# Patient Record
Sex: Female | Born: 2000 | Race: White | Hispanic: No | Marital: Single | Smoking: Never smoker
Health system: Southern US, Community
[De-identification: ages and names within clinical notes are randomized; demographics above are authoritative.]

## PROBLEM LIST (undated history)

## (undated) DIAGNOSIS — G459 Transient cerebral ischemic attack, unspecified: Secondary | ICD-10-CM

## (undated) HISTORY — DX: Transient cerebral ischemic attack, unspecified: G45.9

---

## 2020-07-05 ENCOUNTER — Emergency Department (HOSPITAL_COMMUNITY): Payer: Medicaid Other

## 2020-07-05 ENCOUNTER — Other Ambulatory Visit: Payer: Self-pay

## 2020-07-05 ENCOUNTER — Inpatient Hospital Stay (HOSPITAL_COMMUNITY)
Admission: EM | Admit: 2020-07-05 | Discharge: 2020-07-07 | DRG: 103 | Disposition: A | Discharge status: Home / Self Care | Payer: Medicaid Other | Attending: Neurology | Admitting: Neurology

## 2020-07-05 DIAGNOSIS — R200 Anesthesia of skin: Secondary | ICD-10-CM | POA: Diagnosis present

## 2020-07-05 DIAGNOSIS — G43109 Migraine with aura, not intractable, without status migrainosus: Principal | ICD-10-CM | POA: Diagnosis present

## 2020-07-05 DIAGNOSIS — F17210 Nicotine dependence, cigarettes, uncomplicated: Secondary | ICD-10-CM | POA: Diagnosis present

## 2020-07-05 DIAGNOSIS — Z79890 Hormone replacement therapy (postmenopausal): Secondary | ICD-10-CM

## 2020-07-05 DIAGNOSIS — I639 Cerebral infarction, unspecified: Secondary | ICD-10-CM

## 2020-07-05 DIAGNOSIS — G8191 Hemiplegia, unspecified affecting right dominant side: Secondary | ICD-10-CM | POA: Diagnosis present

## 2020-07-05 DIAGNOSIS — Z20822 Contact with and (suspected) exposure to covid-19: Secondary | ICD-10-CM | POA: Diagnosis present

## 2020-07-05 DIAGNOSIS — Q211 Atrial septal defect: Secondary | ICD-10-CM | POA: Diagnosis not present

## 2020-07-05 DIAGNOSIS — I6389 Other cerebral infarction: Secondary | ICD-10-CM | POA: Diagnosis not present

## 2020-07-05 DIAGNOSIS — R531 Weakness: Secondary | ICD-10-CM

## 2020-07-05 LAB — COMPREHENSIVE METABOLIC PANEL
ALT: 14 U/L (ref 0–44)
AST: 19 U/L (ref 15–41)
Albumin: 4.2 g/dL (ref 3.5–5.0)
Alkaline Phosphatase: 82 U/L (ref 38–126)
Anion gap: 4 — ABNORMAL LOW (ref 5–15)
BUN: 9 mg/dL (ref 6–20)
CO2: 28 mmol/L (ref 22–32)
Calcium: 9.2 mg/dL (ref 8.9–10.3)
Chloride: 105 mmol/L (ref 98–111)
Creatinine, Ser: 0.81 mg/dL (ref 0.44–1.00)
GFR, Estimated: >60 mL/min (ref >60)
Glucose, Bld: 98 mg/dL (ref 70–99)
Potassium: 3.3 mmol/L — ABNORMAL LOW (ref 3.5–5.1)
Sodium: 137 mmol/L (ref 135–145)
Total Bilirubin: 0.8 mg/dL (ref 0.3–1.2)
Total Protein: 8.2 g/dL — ABNORMAL HIGH (ref 6.5–8.1)

## 2020-07-05 LAB — CBC
HCT: 42.9 % (ref 36.0–46.0)
Hemoglobin: 14.2 g/dL (ref 12.0–15.0)
MCH: 29.3 pg (ref 26.0–34.0)
MCHC: 33.1 g/dL (ref 30.0–36.0)
MCV: 88.5 fL (ref 80.0–100.0)
Platelets: 322 10*3/uL (ref 150–400)
RBC: 4.85 MIL/uL (ref 3.87–5.11)
RDW: 12.1 % (ref 11.5–15.5)
WBC: 6 10*3/uL (ref 4.0–10.5)
nRBC: 0 % (ref 0.0–0.2)

## 2020-07-05 LAB — DIFFERENTIAL
Abs Immature Granulocytes: 0.01 10*3/uL (ref 0.00–0.07)
Basophils Absolute: 0 10*3/uL (ref 0.0–0.1)
Basophils Relative: 1 %
Eosinophils Absolute: 0.1 10*3/uL (ref 0.0–0.5)
Eosinophils Relative: 2 %
Immature Granulocytes: 0 %
Lymphocytes Relative: 31 %
Lymphs Abs: 1.9 10*3/uL (ref 0.7–4.0)
Monocytes Absolute: 0.6 10*3/uL (ref 0.1–1.0)
Monocytes Relative: 10 %
Neutro Abs: 3.3 10*3/uL (ref 1.7–7.7)
Neutrophils Relative %: 56 %

## 2020-07-05 LAB — I-STAT CHEM 8, ED
BUN: 9 mg/dL (ref 6–20)
Calcium, Ion: 1.25 mmol/L (ref 1.15–1.40)
Chloride: 104 mmol/L (ref 98–111)
Creatinine, Ser: 0.8 mg/dL (ref 0.44–1.00)
Glucose, Bld: 95 mg/dL (ref 70–99)
HCT: 44 % (ref 36.0–46.0)
Hemoglobin: 15 g/dL (ref 12.0–15.0)
Potassium: 3.4 mmol/L — ABNORMAL LOW (ref 3.5–5.1)
Sodium: 141 mmol/L (ref 135–145)
TCO2: 24 mmol/L (ref 22–32)

## 2020-07-05 LAB — I-STAT BETA HCG BLOOD, ED (MC, WL, AP ONLY): I-stat hCG, quantitative: <5 m[IU]/mL (ref <5)

## 2020-07-05 LAB — ANTITHROMBIN III: AntiThromb III Func: 120 % (ref 75–120)

## 2020-07-05 LAB — ETHANOL: Alcohol, Ethyl (B): <10 mg/dL (ref <10)

## 2020-07-05 LAB — PROTIME-INR
INR: 1 (ref 0.8–1.2)
Prothrombin Time: 13.4 seconds (ref 11.4–15.2)

## 2020-07-05 LAB — C-REACTIVE PROTEIN: CRP: 1.7 mg/dL — ABNORMAL HIGH (ref <1.0)

## 2020-07-05 LAB — APTT: aPTT: 33 seconds (ref 24–36)

## 2020-07-05 LAB — SEDIMENTATION RATE: Sed Rate: 32 mm/hr — ABNORMAL HIGH (ref 0–22)

## 2020-07-05 LAB — CBG MONITORING, ED: Glucose-Capillary: 74 mg/dL (ref 70–99)

## 2020-07-05 LAB — MRSA NEXT GEN BY PCR, NASAL: MRSA by PCR Next Gen: NOT DETECTED

## 2020-07-05 IMAGING — CT CT HEAD CODE STROKE
4 series · 16 of 47 positions shown, 18 images · non-contrast
Comparison: No pertinent prior exams available for comparison.

CLINICAL DATA: Code stroke. Neuro deficit, acute, stroke suspected.
Right-sided drift.

EXAM:
CT HEAD WITHOUT CONTRAST
TECHNIQUE: Contiguous axial images were obtained from the base of the skull
through the vertex without intravenous contrast.

[Series 3: head wo · axial · 0.42mm/px · z∈[+1294,+1404]mm · 7 of 30 slices shown, 9 images]
[im 4/30  brain]
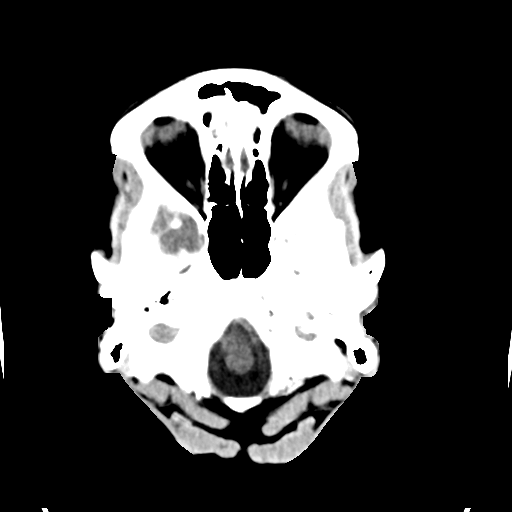
[im 4/30  bone]
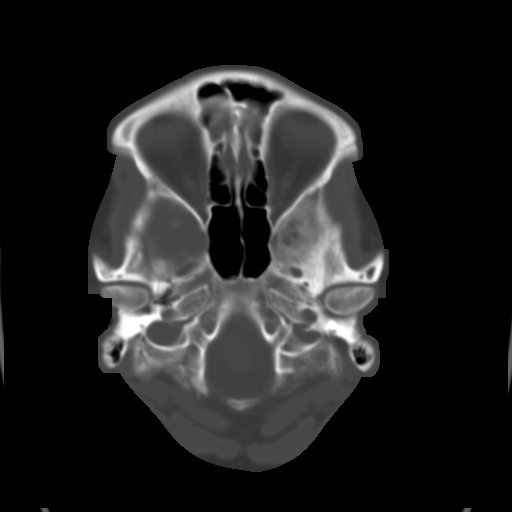
[im 8/30  brain]
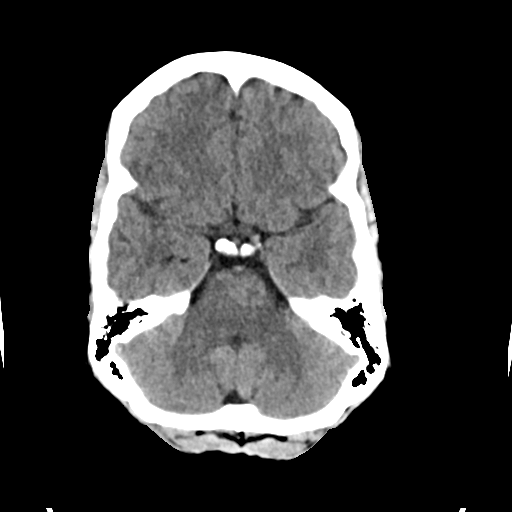
[im 11/30  brain]
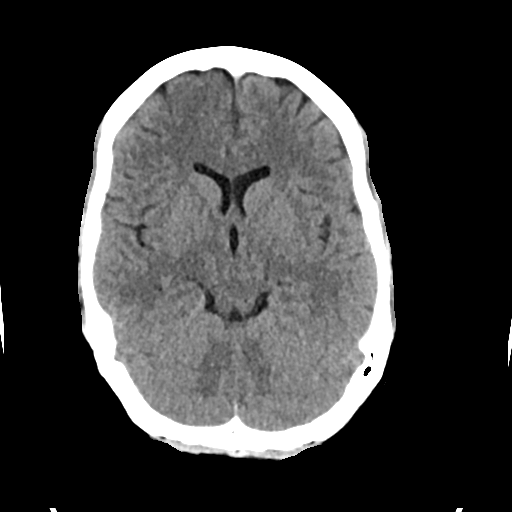
[im 15/30  brain]
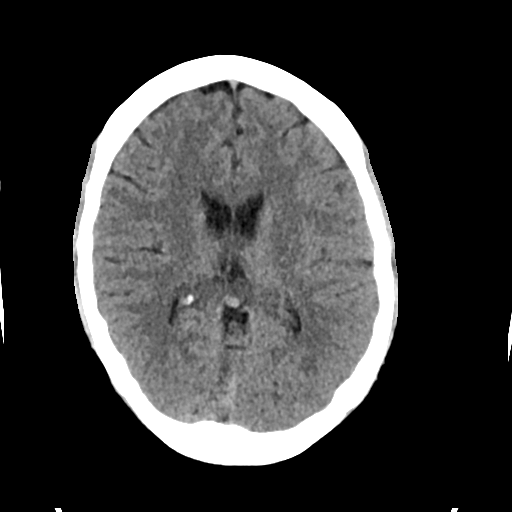
[im 19/30  brain]
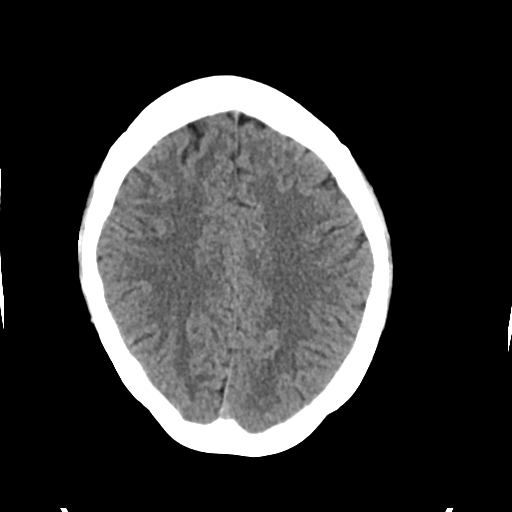
[im 19/30  bone]
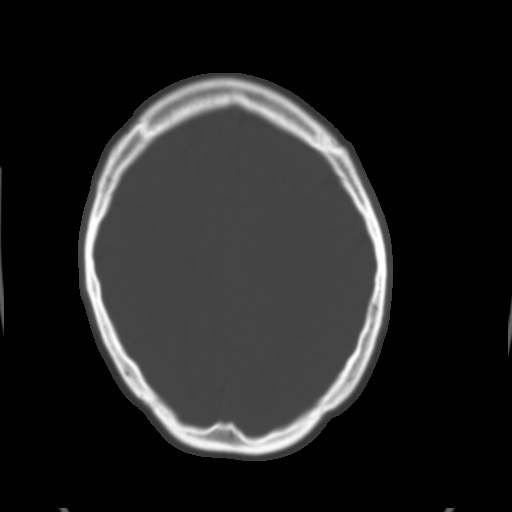
[im 22/30  brain]
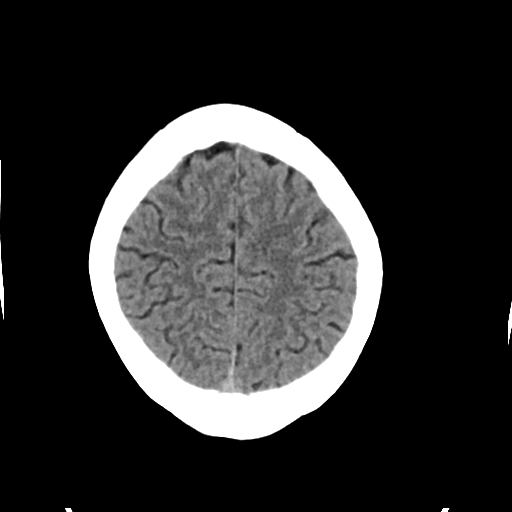
[im 26/30  brain]
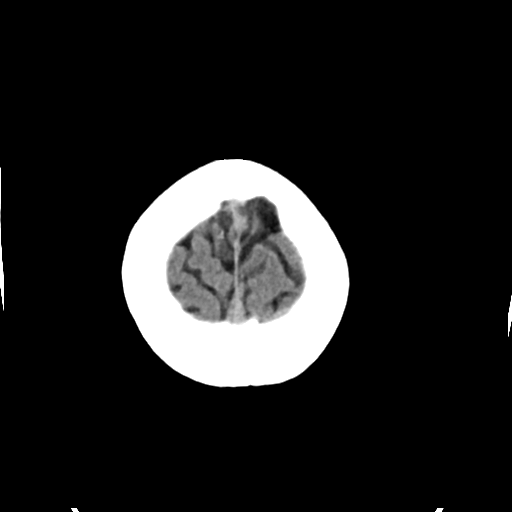

[Series 4: head bone · axial · 0.42mm/px · z∈[+1293,+1323]mm · 3 of 75 slices shown]
[im 8/75  bone]
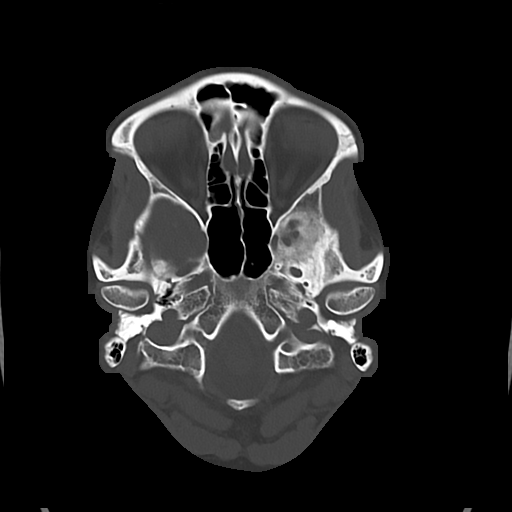
[im 15/75  bone]
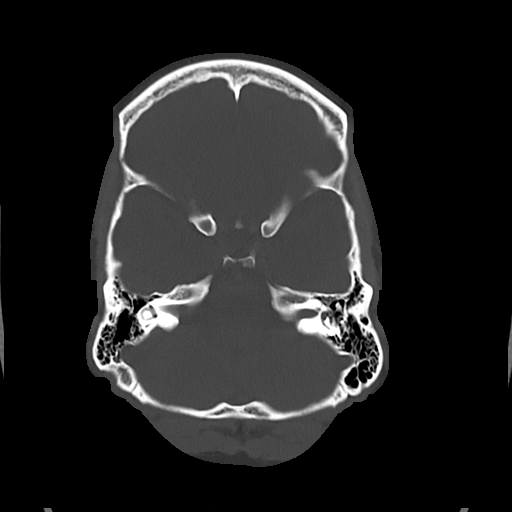
[im 23/75  bone]
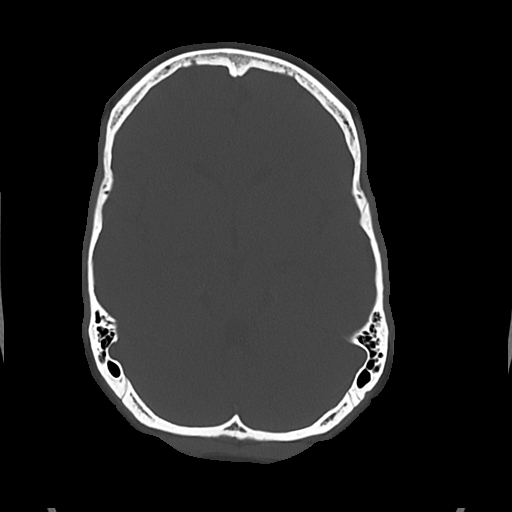

[Series 5: cor soft · coronal · 0.31mm/px · 3 of 64 slices shown]
[im 22/64  brain]
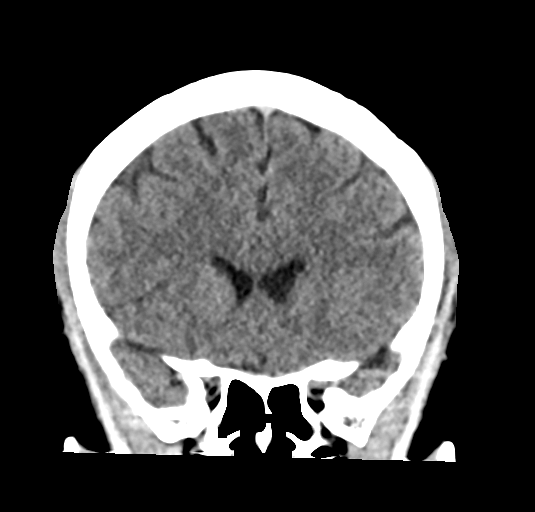
[im 29/64  brain]
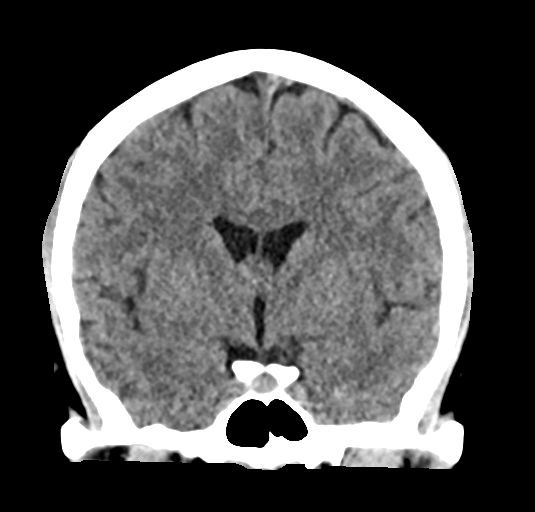
[im 36/64  brain]
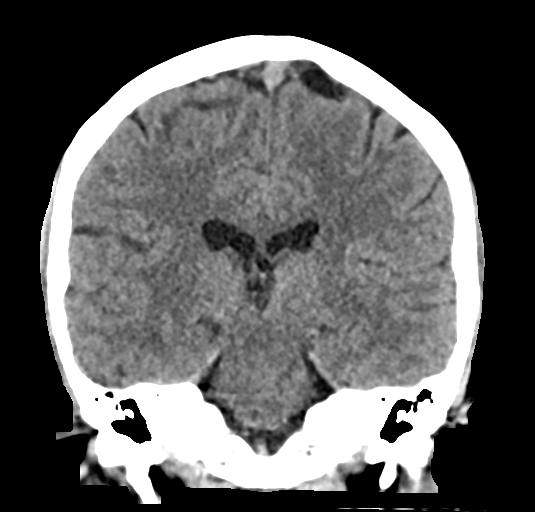

[Series 6: sag soft · sagittal · 0.33mm/px · 3 of 50 slices shown]
[im 17/50  brain]
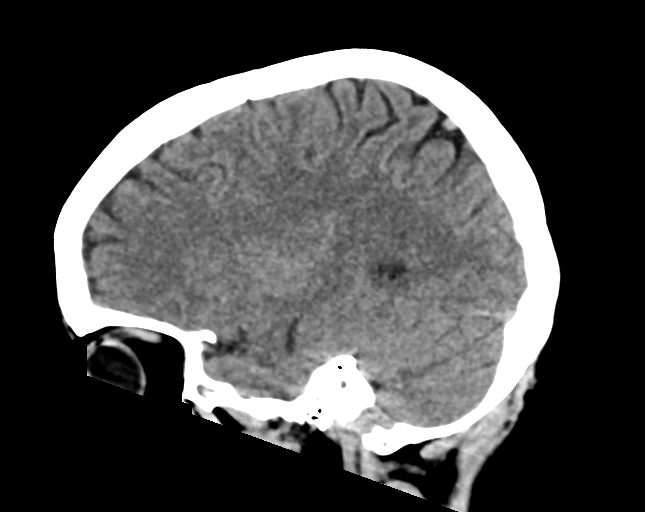
[im 25/50  brain]
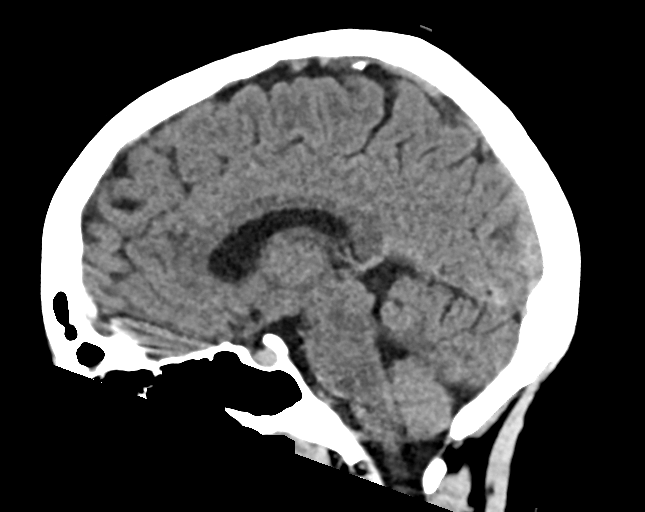
[im 33/50  brain]
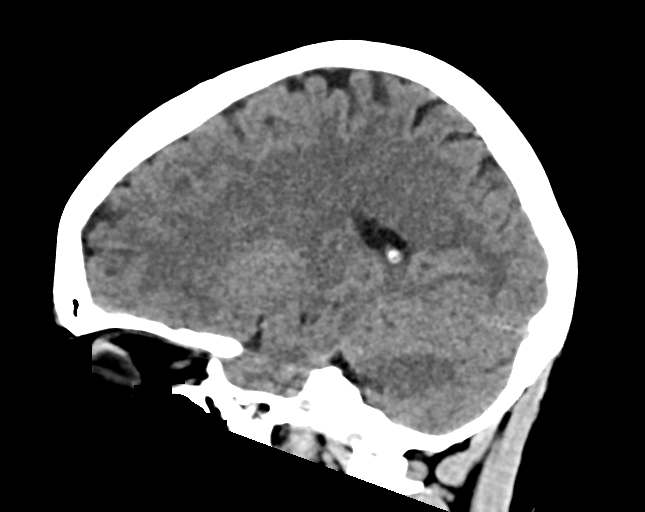

[16 of 47 positions shown; findings below may reference images not displayed]

FINDINGS: Brain:

Cerebral volume is normal.

There is no acute intracranial hemorrhage.

No demarcated cortical infarct.

No extra-axial fluid collection.

No evidence of an intracranial mass.

No midline shift.

Vascular: No hyperdense vessel.

Skull: Normal. Negative for fracture or focal lesion.

Sinuses/Orbits: Visualized orbits show no acute finding. No
significant paranasal sinus disease at the imaged levels.

ASPECTS (Alberta Stroke Program Early CT Score)

- Ganglionic level infarction (caudate, lentiform nuclei, internal
capsule, insula, M1-M3 cortex): 7

- Supraganglionic infarction (M4-M6 cortex): 3

Total score (0-10 with 10 being normal): 10

These results were called by telephone at the time of interpretation
on [DATE] at [DATE] to provider Dr. MINNITI, who verbally
acknowledged these results.
IMPRESSION: No evidence of acute intracranial abnormality.  ASPECTS is 10.

## 2020-07-05 MED ORDER — ACETAMINOPHEN 650 MG RE SUPP: 650.0000 mg | RECTAL | Status: DC | PRN

## 2020-07-05 MED ORDER — CHLORHEXIDINE GLUCONATE CLOTH 2 % EX PADS
6.0000 | MEDICATED_PAD | Freq: Every day | CUTANEOUS | Status: DC
  Administered 2020-07-05: 6 via TOPICAL

## 2020-07-05 MED ORDER — PANTOPRAZOLE SODIUM 40 MG IV SOLR
40.0000 mg | Freq: Every day | INTRAVENOUS | Status: DC
  Administered 2020-07-05: 40 mg via INTRAVENOUS
  Filled 2020-07-05: qty 40

## 2020-07-05 MED ORDER — STROKE: EARLY STAGES OF RECOVERY BOOK
Freq: Once | Status: DC
  Filled 2020-07-05: qty 1

## 2020-07-05 MED ORDER — SODIUM CHLORIDE 0.9 % IV SOLN
50.0000 mL | Freq: Once | INTRAVENOUS | Status: AC
  Administered 2020-07-05: 50 mL via INTRAVENOUS

## 2020-07-05 MED ORDER — SODIUM CHLORIDE 0.9 % IV SOLN: INTRAVENOUS | Status: DC

## 2020-07-05 MED ORDER — ALTEPLASE (STROKE) FULL DOSE INFUSION
0.9000 mg/kg | Freq: Once | INTRAVENOUS | Status: AC
  Administered 2020-07-05: 55.1 mg via INTRAVENOUS
  Filled 2020-07-05: qty 100

## 2020-07-05 MED ORDER — IOHEXOL 350 MG/ML SOLN
100.0000 mL | Freq: Once | INTRAVENOUS | Status: AC | PRN
  Administered 2020-07-05: 100 mL via INTRAVENOUS

## 2020-07-05 MED ORDER — IOHEXOL 350 MG/ML SOLN
50.0000 mL | Freq: Once | INTRAVENOUS | Status: AC | PRN
  Administered 2020-07-05: 50 mL via INTRAVENOUS

## 2020-07-05 MED ORDER — SENNOSIDES-DOCUSATE SODIUM 8.6-50 MG PO TABS: 1.0000 | ORAL_TABLET | Freq: Every evening | ORAL | Status: DC | PRN

## 2020-07-05 MED ORDER — ACETAMINOPHEN 160 MG/5ML PO SOLN: 650.0000 mg | ORAL | Status: DC | PRN

## 2020-07-05 MED ORDER — ACETAMINOPHEN 325 MG PO TABS
650.0000 mg | ORAL_TABLET | ORAL | Status: DC | PRN
  Administered 2020-07-05: 650 mg via ORAL
  Filled 2020-07-05: qty 2

## 2020-07-05 NOTE — Progress Notes (Addendum)
Pharmacist Code Stroke Response  Notified to mix tPA at 1752 by Dr. Derry Lory Delivered tPA to RN at 1759  tPA dose = 5.5mg  bolus over 1 minute followed by 49.6mg  for a total dose of 55.1mg  over 1 hour  Issues/delays encountered (if applicable): Had to wait on patient consent   Vinnie Level, PharmD., BCPS, BCCCP Clinical Pharmacist Please refer to [facility] for unit-specific pharmacist

## 2020-07-05 NOTE — ED Triage Notes (Signed)
Pt here with reports of numbness to her R face and her R neck and R arm are "tingling" onset approx 30 minutes ago.

## 2020-07-05 NOTE — ED Provider Notes (Signed)
[facility] EMERGENCY DEPARTMENT Provider Note   CSN: [id number] Arrival date & time: 07/05/20  1644     History CC:  Numbness and weakness   Katherine Sparks is a 20 y.o. female who reports no significant PMHx presenting to ED with right facial numbness and right arm weakness, onset 4 pm this afternoon.  She reports feeling unwell earlier today, with subjective fever and vomiting.  She noted loss of feeling in the right half of her face and her right arm at 4 pm and came promptly to the ED.  She is unsure whether she has a headache.  No hx of CVA, TIA, stroke. No other medical problems NKDA Does not use illicit drugs  HPI     No past medical history on file.  Patient Active Problem List   Diagnosis Date Noted   Stroke determined by clinical assessment (HCC) 07/05/2020     OB History   No obstetric history on file.     No family history on file.     Home Medications Prior to Admission medications   Not on File    Allergies    Patient has no allergy information on record.  Review of Systems   Review of Systems  Constitutional:  Positive for fever. Negative for chills.  Eyes:  Negative for pain and visual disturbance.  Respiratory:  Negative for cough and shortness of breath.   Cardiovascular:  Negative for chest pain and palpitations.  Gastrointestinal:  Positive for nausea and vomiting. Negative for abdominal pain.  Musculoskeletal:  Negative for arthralgias and back pain.  Skin:  Negative for color change and rash.  Neurological:  Positive for facial asymmetry, weakness and numbness. Negative for seizures, syncope, speech difficulty and light-headedness.  All other systems reviewed and are negative.  Physical Exam Updated Vital Signs BP 109/71   Pulse 69   Temp 97.8 F (36.6 C) (Oral)   Resp (!) 22   Wt 61.2 kg   SpO2 99%   Physical Exam Constitutional:      General: She is not in acute distress. HENT:     Head:  Normocephalic and atraumatic.  Eyes:     Conjunctiva/sclera: Conjunctivae normal.     Pupils: Pupils are equal, round, and reactive to light.  Cardiovascular:     Rate and Rhythm: Normal rate and regular rhythm.     Pulses: Normal pulses.  Pulmonary:     Effort: Pulmonary effort is normal. No respiratory distress.  Abdominal:     General: There is no distension.     Tenderness: There is no abdominal tenderness.  Skin:    General: Skin is warm and dry.  Neurological:     General: No focal deficit present.     Mental Status: She is alert and oriented to person, place, and time. Mental status is at baseline.     Comments: Right sided facial paresthesia Otherwise CN nerves intact Right grip 4/5 strength  +Pronator drift right side Right arm neglect on exam Remainder of neuro exam normal  Psychiatric:        Mood and Affect: Mood normal.        Behavior: Behavior normal.    ED Results / Procedures / Treatments   Labs (all labs ordered are listed, but only abnormal results are displayed) Labs Reviewed  COMPREHENSIVE METABOLIC PANEL - Abnormal; Notable for the following components:      Result Value   Potassium 3.3 (*)    Total Protein  8.2 (*)    Anion gap 4 (*)    All other components within normal limits  RAPID URINE DRUG SCREEN, HOSP PERFORMED - Abnormal; Notable for the following components:   Tetrahydrocannabinol POSITIVE (*)    All other components within normal limits  HEMOGLOBIN A1C - Abnormal; Notable for the following components:   Hgb A1c MFr Bld 4.7 (*)    All other components within normal limits  COMPREHENSIVE METABOLIC PANEL - Abnormal; Notable for the following components:   Albumin 3.3 (*)    Total Bilirubin 1.5 (*)    All other components within normal limits  URINALYSIS, COMPLETE (UACMP) WITH MICROSCOPIC - Abnormal; Notable for the following components:   Hgb urine dipstick TRACE (*)    Bacteria, UA FEW (*)    All other components within normal limits   SEDIMENTATION RATE - Abnormal; Notable for the following components:   Sed Rate 32 (*)    All other components within normal limits  C-REACTIVE PROTEIN - Abnormal; Notable for the following components:   CRP 1.7 (*)    All other components within normal limits  I-STAT CHEM 8, ED - Abnormal; Notable for the following components:   Potassium 3.4 (*)    All other components within normal limits  MRSA NEXT GEN BY PCR, NASAL  RESP PANEL BY RT-PCR (FLU A&B, COVID) ARPGX2  ETHANOL  PROTIME-INR  APTT  CBC  DIFFERENTIAL  HIV ANTIBODY (ROUTINE TESTING W REFLEX)  LIPID PANEL  CBC  PREGNANCY, URINE  ANTITHROMBIN III  ANA W/REFLEX IF POSITIVE  ANCA TITERS  ANTIPHOSPHOLIPID SYNDROME EVAL, BLD  ANTI-DNA ANTIBODY, DOUBLE-STRANDED  FACTOR 5 LEIDEN  MTHFR DNA ANALYSIS  I-STAT BETA HCG BLOOD, ED ([facility], [facility], [facility] ONLY)  CBG MONITORING, ED    EKG None  Radiology CT CEREBRAL PERFUSION W CONTRAST  Result Date: 07/05/2020 CLINICAL DATA:  Stroke/TIA, assess intracranial arteries. Stroke/TIA, assess extracranial arteries. EXAM: CT ANGIOGRAPHY HEAD AND NECK CT PERFUSION BRAIN TECHNIQUE: Multidetector CT imaging of the head and neck was performed using the standard protocol during bolus administration of intravenous contrast. Multiplanar CT image reconstructions and MIPs were obtained to evaluate the vascular anatomy. Carotid stenosis measurements (when applicable) are obtained utilizing NASCET criteria, using the distal internal carotid diameter as the denominator. Multiphase CT imaging of the brain was performed following IV bolus contrast injection. Subsequent parametric perfusion maps were calculated using RAPID software. CONTRAST:  67mL OMNIPAQUE IOHEXOL 350 MG/ML SOLN, OMNIPAQUE IOHEXOL 350 MG/ML SOLN; OMNIPAQUE IOHEXOL 350 MG/ML SOLN COMPARISON:  Noncontrast head CT performed earlier today 07/05/2020. FINDINGS: CTA NECK FINDINGS Aortic arch: The visualized aortic arch is normal in caliber.  Standard aortic branching. Streak artifact from a dense left-sided contrast bolus partially obscures the left subclavian artery. Within this limitation, no appreciable hemodynamically significant innominate or proximal subclavian artery stenosis. Right carotid system: CCA and ICA patent within the neck without stenosis or significant atherosclerotic disease. Left carotid system: CCA and ICA patent within the neck without stenosis or significant atherosclerotic disease. Vertebral arteries: Vertebral arteries codominant and patent within the neck without stenosis or atherosclerotic disease. Skeleton: No acute bony abnormality or aggressive osseous lesion. Reversal of the expected cervical lordosis. Other neck: No neck mass or cervical lymphadenopathy. Upper chest: No consolidation within the imaged lung apices. Review of the MIP images confirms the above findings CTA HEAD FINDINGS Anterior circulation: The intracranial internal carotid arteries are patent. The M1 middle cerebral arteries are patent. No M2 proximal branch occlusion or high-grade proximal  stenosis is identified. The anterior cerebral arteries are patent. Posterior circulation: The intracranial vertebral arteries are patent. The basilar artery is patent. The posterior cerebral arteries are patent. Venous sinuses: Within the limitations of contrast timing, no convincing thrombus. Anatomic variants: Posterior communicating arteries are hypoplastic or absent bilaterally. Review of the MIP images confirms the above findings CT Brain Perfusion Findings: Inadequate contrast bolus or contrast timing, limiting reliability CBF (<30%) Volume: 0mL Perfusion (Tmax>6.0s) volume: 0mL Mismatch Volume: 0mL Infarction Location:None identified No intracranial large vessel occlusion is identified. These results were called by telephone at the time of interpretation on 07/05/2020 at 6:39 pm to provider [facility] , who verbally acknowledged these results. IMPRESSION:  CTA neck: The common carotid, internal carotid and vertebral arteries are patent within the neck without stenosis. CTA head: No intracranial large vessel occlusion or proximal high-grade arterial stenosis identified. CT perfusion head: Inadequate contrast bolus or contrast timing, limiting reliability of the perfusion data. Within this limitation, the perfusion software identifies no core infarct or critically hypoperfused parenchyma. Electronically Signed   By: Jackey Loge DO   On: 07/05/2020 18:40   CT HEAD CODE STROKE WO CONTRAST  Result Date: 07/05/2020 CLINICAL DATA:  Code stroke. Neuro deficit, acute, stroke suspected. Right-sided drift. EXAM: CT HEAD WITHOUT CONTRAST TECHNIQUE: Contiguous axial images were obtained from the base of the skull through the vertex without intravenous contrast. COMPARISON:  No pertinent prior exams available for comparison. FINDINGS: Brain: Cerebral volume is normal. There is no acute intracranial hemorrhage. No demarcated cortical infarct. No extra-axial fluid collection. No evidence of an intracranial mass. No midline shift. Vascular: No hyperdense vessel. Skull: Normal. Negative for fracture or focal lesion. Sinuses/Orbits: Visualized orbits show no acute finding. No significant paranasal sinus disease at the imaged levels. ASPECTS (Alberta Stroke Program Early CT Score) - Ganglionic level infarction (caudate, lentiform nuclei, internal capsule, insula, M1-M3 cortex): 7 - Supraganglionic infarction (M4-M6 cortex): 3 Total score (0-10 with 10 being normal): 10 These results were called by telephone at the time of interpretation on 07/05/2020 at 5:51 pm to provider Dr. Derry Lory, who verbally acknowledged these results. IMPRESSION: No evidence of acute intracranial abnormality.  ASPECTS is 10. Electronically Signed   By: Jackey Loge DO   On: 07/05/2020 17:54   CT ANGIO HEAD CODE STROKE  Result Date: 07/05/2020 CLINICAL DATA:  Stroke/TIA, assess intracranial arteries.  Stroke/TIA, assess extracranial arteries. EXAM: CT ANGIOGRAPHY HEAD AND NECK CT PERFUSION BRAIN TECHNIQUE: Multidetector CT imaging of the head and neck was performed using the standard protocol during bolus administration of intravenous contrast. Multiplanar CT image reconstructions and MIPs were obtained to evaluate the vascular anatomy. Carotid stenosis measurements (when applicable) are obtained utilizing NASCET criteria, using the distal internal carotid diameter as the denominator. Multiphase CT imaging of the brain was performed following IV bolus contrast injection. Subsequent parametric perfusion maps were calculated using RAPID software. CONTRAST:  50mL OMNIPAQUE IOHEXOL 350 MG/ML SOLN, OMNIPAQUE IOHEXOL 350 MG/ML SOLN; OMNIPAQUE IOHEXOL 350 MG/ML SOLN COMPARISON:  Noncontrast head CT performed earlier today 07/05/2020. FINDINGS: CTA NECK FINDINGS Aortic arch: The visualized aortic arch is normal in caliber. Standard aortic branching. Streak artifact from a dense left-sided contrast bolus partially obscures the left subclavian artery. Within this limitation, no appreciable hemodynamically significant innominate or proximal subclavian artery stenosis. Right carotid system: CCA and ICA patent within the neck without stenosis or significant atherosclerotic disease. Left carotid system: CCA and ICA patent within the neck without stenosis or significant atherosclerotic  disease. Vertebral arteries: Vertebral arteries codominant and patent within the neck without stenosis or atherosclerotic disease. Skeleton: No acute bony abnormality or aggressive osseous lesion. Reversal of the expected cervical lordosis. Other neck: No neck mass or cervical lymphadenopathy. Upper chest: No consolidation within the imaged lung apices. Review of the MIP images confirms the above findings CTA HEAD FINDINGS Anterior circulation: The intracranial internal carotid arteries are patent. The M1 middle cerebral arteries are  patent. No M2 proximal branch occlusion or high-grade proximal stenosis is identified. The anterior cerebral arteries are patent. Posterior circulation: The intracranial vertebral arteries are patent. The basilar artery is patent. The posterior cerebral arteries are patent. Venous sinuses: Within the limitations of contrast timing, no convincing thrombus. Anatomic variants: Posterior communicating arteries are hypoplastic or absent bilaterally. Review of the MIP images confirms the above findings CT Brain Perfusion Findings: Inadequate contrast bolus or contrast timing, limiting reliability CBF (<30%) Volume: 52mL Perfusion (Tmax>6.0s) volume: 75mL Mismatch Volume: 36mL Infarction Location:None identified No intracranial large vessel occlusion is identified. These results were called by telephone at the time of interpretation on 07/05/2020 at 6:39 pm to provider [facility] , who verbally acknowledged these results. IMPRESSION: CTA neck: The common carotid, internal carotid and vertebral arteries are patent within the neck without stenosis. CTA head: No intracranial large vessel occlusion or proximal high-grade arterial stenosis identified. CT perfusion head: Inadequate contrast bolus or contrast timing, limiting reliability of the perfusion data. Within this limitation, the perfusion software identifies no core infarct or critically hypoperfused parenchyma. Electronically Signed   By: Jackey Loge DO   On: 07/05/2020 18:40   CT ANGIO NECK CODE STROKE  Result Date: 07/05/2020 CLINICAL DATA:  Stroke/TIA, assess intracranial arteries. Stroke/TIA, assess extracranial arteries. EXAM: CT ANGIOGRAPHY HEAD AND NECK CT PERFUSION BRAIN TECHNIQUE: Multidetector CT imaging of the head and neck was performed using the standard protocol during bolus administration of intravenous contrast. Multiplanar CT image reconstructions and MIPs were obtained to evaluate the vascular anatomy. Carotid stenosis measurements (when  applicable) are obtained utilizing NASCET criteria, using the distal internal carotid diameter as the denominator. Multiphase CT imaging of the brain was performed following IV bolus contrast injection. Subsequent parametric perfusion maps were calculated using RAPID software. CONTRAST:  52mL OMNIPAQUE IOHEXOL 350 MG/ML SOLN, OMNIPAQUE IOHEXOL 350 MG/ML SOLN; OMNIPAQUE IOHEXOL 350 MG/ML SOLN COMPARISON:  Noncontrast head CT performed earlier today 07/05/2020. FINDINGS: CTA NECK FINDINGS Aortic arch: The visualized aortic arch is normal in caliber. Standard aortic branching. Streak artifact from a dense left-sided contrast bolus partially obscures the left subclavian artery. Within this limitation, no appreciable hemodynamically significant innominate or proximal subclavian artery stenosis. Right carotid system: CCA and ICA patent within the neck without stenosis or significant atherosclerotic disease. Left carotid system: CCA and ICA patent within the neck without stenosis or significant atherosclerotic disease. Vertebral arteries: Vertebral arteries codominant and patent within the neck without stenosis or atherosclerotic disease. Skeleton: No acute bony abnormality or aggressive osseous lesion. Reversal of the expected cervical lordosis. Other neck: No neck mass or cervical lymphadenopathy. Upper chest: No consolidation within the imaged lung apices. Review of the MIP images confirms the above findings CTA HEAD FINDINGS Anterior circulation: The intracranial internal carotid arteries are patent. The M1 middle cerebral arteries are patent. No M2 proximal branch occlusion or high-grade proximal stenosis is identified. The anterior cerebral arteries are patent. Posterior circulation: The intracranial vertebral arteries are patent. The basilar artery is patent. The posterior cerebral arteries are patent. Venous  sinuses: Within the limitations of contrast timing, no convincing thrombus. Anatomic variants:  Posterior communicating arteries are hypoplastic or absent bilaterally. Review of the MIP images confirms the above findings CT Brain Perfusion Findings: Inadequate contrast bolus or contrast timing, limiting reliability CBF (<30%) Volume: 0mL Perfusion (Tmax>6.0s) volume: 0mL Mismatch Volume: 0mL Infarction Location:None identified No intracranial large vessel occlusion is identified. These results were called by telephone at the time of interpretation on 07/05/2020 at 6:39 pm to provider [facility] , who verbally acknowledged these results. IMPRESSION: CTA neck: The common carotid, internal carotid and vertebral arteries are patent within the neck without stenosis. CTA head: No intracranial large vessel occlusion or proximal high-grade arterial stenosis identified. CT perfusion head: Inadequate contrast bolus or contrast timing, limiting reliability of the perfusion data. Within this limitation, the perfusion software identifies no core infarct or critically hypoperfused parenchyma. Electronically Signed   By: Jackey Loge DO   On: 07/05/2020 18:40    Procedures .Critical Care  Date/Time: 07/06/2020 10:20 AM Performed by: Terald Sleeper, MD Authorized by: Terald Sleeper, MD   Critical care provider statement:    Critical care time (minutes):  45   Critical care was necessary to treat or prevent imminent or life-threatening deterioration of the following conditions:  CNS failure or compromise   Critical care was time spent personally by me on the following activities:  Discussions with consultants, evaluation of patient's response to treatment, examination of patient, ordering and performing treatments and interventions, ordering and review of laboratory studies, ordering and review of radiographic studies, pulse oximetry, re-evaluation of patient's condition, obtaining history from patient or surrogate and review of old charts Comments:     Stroke evaluation - tPA given     Medications  Ordered in ED Medications   stroke: mapping our early stages of recovery book (has no administration in time range)  0.9 %  sodium chloride infusion ( Intravenous Infusion Verify 07/06/20 0900)  acetaminophen (TYLENOL) tablet 650 mg (650 mg Oral Given 07/05/20 2303)    Or  acetaminophen (TYLENOL) 160 MG/5ML solution 650 mg ( Per Tube See Alternative 07/05/20 2303)    Or  acetaminophen (TYLENOL) suppository 650 mg ( Rectal See Alternative 07/05/20 2303)  senna-docusate (Senokot-S) tablet 1 tablet (has no administration in time range)  pantoprazole (PROTONIX) injection 40 mg (40 mg Intravenous Given 07/05/20 2123)  Chlorhexidine Gluconate Cloth 2 % PADS 6 each (6 each Topical Given 07/05/20 2033)  LORazepam (ATIVAN) injection 1 mg (has no administration in time range)  alteplase (ACTIVASE) 1 mg/mL infusion 55.1 mg (0 mg Intravenous Stopped 07/05/20 1904)    Followed by  0.9 %  sodium chloride infusion (50 mLs Intravenous New Bag/Given 07/05/20 1905)  iohexol (OMNIPAQUE) 350 MG/ML injection 100 mL (100 mLs Intravenous Contrast Given 07/05/20 1816)  iohexol (OMNIPAQUE) 350 MG/ML injection 50 mL (50 mLs Intravenous Contrast Given 07/05/20 1818)    ED Course  I have reviewed the triage vital signs and the nursing notes.  Pertinent labs & imaging results that were available during my care of the patient were reviewed by me and considered in my medical decision making (see chart for details).  20 yo female here with acute onset right sided weakness and numbness within 1 hour prior to arrival  Ddx includes CVA vs complex migraine vs illicit drug side effect vs new onset MS vs other  Code stroke activated in triage after I assessed the patient Neurology consulted - after discussion with patient, tPA was  given for concern for clinical stroke CTH and CTA subsequently did not show LVO or evident stroke.   Neurology team to admit the patient for monitoring Labs from arrival reviewed - BMP and CBC  unremarkable.   Glucose normal.  Pregnancy negative.   UDS pending.  Clinical Course as of 07/06/20 1023  Thu Jul 05, 2020  1736 I spoke with Sal with neurology who recommended activating a code stroke.  Triage RN is aware.  [EH]  1754 Pt assessed by myself and neurology in triage - at CT now [MT]  1817 Neurologist initiated tPA after discussing risks and benefits with pt [MT]  1857 No large vessel infarct noted on CT scan.  However given this clinical presentation, the patient will be admitted to the neurology service.  [MT]    Clinical Course User Index [EH] Cristina GongHammond, Elizabeth W, PA-C [MT] Renaye Rakersrifan, Kermit BaloMatthew J, MD    Final Clinical Impression(s) / ED Diagnoses Final diagnoses:  Right sided weakness    Rx / DC Orders ED Discharge Orders     None        Terald Sleeperrifan, Stassi Fadely J, MD 07/06/20 1023

## 2020-07-05 NOTE — ED Provider Notes (Signed)
Emergency Medicine Provider Triage Evaluation Note  Katherine Sparks , a 20 y.o. female  was evaluated in triage.  Pt complains of right-sided face numbness and right arm weakness.  This started at 4:02 PM today.  She has no history of similar. She notes that she has been having fevers over the past few days.  No headache, neck pain or chiropractic adjustments.  She states that she does not have a family history of MS. She reports congestion.   Review of Systems  Positive: Fevers at home, congestion, face and arm weakness Negative: headache  Physical Exam  BP 118/86 (BP Location: Left Arm)   Pulse 73   Temp 98.9 F (37.2 C) (Oral)   Resp 18   SpO2 100%  Gen:   Awake, no distress   Resp:  Normal effort  MSK:   Moves extremities without difficulty  Other:  Subjective decreased sensation to light touch over right-sided face.  Right arm drops compared to left however does not pronate.  Slight weakness on the right leg. No facial droop.  Grip strength weaker right arm than left.   Medical Decision Making  Medically screening exam initiated at 5:36 PM.  Appropriate orders placed.  Ilham I Scronce was informed that the remainder of the evaluation will be completed by another provider, this initial triage assessment does not replace that evaluation, and the importance of remaining in the ED until their evaluation is complete.  RN got Dr. Renaye Rakers to inside to evaluate patient. I spoke with Sal with neurology whom I discussed patient's history, care and symptoms, recommended activating as a code stroke.  Code stroke orders are placed.   Cristina Gong, [place] 07/05/20 1741    Terald Sleeper, MD 07/06/20 [phone]

## 2020-07-05 NOTE — H&P (Signed)
Neurology History and Physical Assessment  Reason for Consult: Right-sided weakness and right sensory deficits Referring Physician: Dorise Bullion, PA  CC: Right-sided weakness and right sensory deficits  History is obtained from: Patient, Patient is new to our system so unable to review chart for past medical history  HPI: Katherine Sparks is a 20 y.o. left-handed female with a history of tobacco use and previous marijuana use who presented to the ED today via private vehicle for acute onset of right-sided weakness and numbness. She states that the right-sided weakness and numbness started at 16:02 this afternoon without improvement. She notified the EDP that she had a sudden onset of nausea and vomiting prior to her onset of weakness and numbness. On evaluation in triage, she was activated as a Code Stroke for neurology evaluation.   LKW: 16:02 tpa given?: yes, symptoms concerning for acute stroke and within the time window. Initially offered at 17:52 but patient wished to speak with her mother before deciding on treatment. Able to reach her mother via telephone and patient consented to tPA treatment at 18:02. tPA was started at 18:03 IR Thrombectomy? No, presentation not consistent with LVO, no LVO on vessel imaging Modified Rankin Scale: 0-Completely asymptomatic and back to baseline post- stroke  ROS: A complete ROS was performed and is negative except as noted in the HPI.   No past medical history on file.  No family history on file.  Social History:   has no history on file for tobacco use, alcohol use, and drug use. Smokes cigarettes, states she has used marijuana in the past  Medications  Current Facility-Administered Medications:     stroke: mapping our early stages of recovery book, , Does not apply, Once, Toberman, Stevi W, NP   alteplase (ACTIVASE) 1 mg/mL infusion 55.1 mg, 0.9 mg/kg, Intravenous, Once **FOLLOWED BY** 0.9 %  sodium chloride infusion, 50 mL, Intravenous, Once,  Erick Blinks, MD   0.9 %  sodium chloride infusion, , Intravenous, Continuous, Toberman, Stevi W, NP   acetaminophen (TYLENOL) tablet 650 mg, 650 mg, Oral, Q4H PRN **OR** acetaminophen (TYLENOL) 160 MG/5ML solution 650 mg, 650 mg, Per Tube, Q4H PRN **OR** acetaminophen (TYLENOL) suppository 650 mg, 650 mg, Rectal, Q4H PRN, Cindie Laroche, Stevi W, NP   pantoprazole (PROTONIX) injection 40 mg, 40 mg, Intravenous, QHS, Toberman, Stevi W, NP   senna-docusate (Senokot-S) tablet 1 tablet, 1 tablet, Oral, QHS PRN, Kara Mead, NP No current outpatient medications on file.  Exam: Current vital signs: BP 118/86 (BP Location: Left Arm)   Pulse 73   Temp 98.9 F (37.2 C) (Oral)   Resp 18   Wt 61.2 kg   SpO2 100%  Vital signs in last 24 hours: Temp:  [98.9 F (37.2 C)] 98.9 F (37.2 C) (06/23 1723) Pulse Rate:  [73] 73 (06/23 1723) Resp:  [18] 18 (06/23 1723) BP: (118)/(86) 118/86 (06/23 1723) SpO2:  [100 %] 100 % (06/23 1723) Weight:  [61.2 kg] 61.2 kg (06/23 1800)  GENERAL: Awake, alert, in no acute distress Psych: Affect appropriate for situation, calm and cooperative with examination. Appears anxious and becomes tearful during assessment.  Head: Normocephalic and atraumatic, without obvious abnormality EENT: Normal conjunctivae, no OP obstruction LUNGS: Normal respiratory effort. Non-labored breathing, 100% SpO2 on room air CV: Regular rate on telemetry, no pedal edema noted, extremities well perfused ABDOMEN - Soft, non-tender, non-distended Ext: warm, well perfused, without obvious deformity  NEURO:  Mental Status: Awake, alert, and oriented to person, place, time, year, month,  age, and situation. She is able to give a clear and coherent history of present illness.  Speech is intact without dysarthria or aphasia.  Naming, repetition, fluency, and comprehension intact. No neglect noted.  Cranial Nerves:  II: PERRL 4 mm/brisk. Visual fields full.  III, IV, VI: EOMI without  ptosis, gaze preference, or nystagmus.  V: Sensation is decreased to light touch on the right face.  VII: Face is symmetric resting and smiling.  VIII: Hearing is intact to voice IX, X: Palate elevation is symmetric. Phonation normal.  XI: Normal sternocleidomastoid and trapezius muscle strength XII: Tongue protrudes midline without fasciculations.   Motor: 5/5 strength present in the left upper and lower extremities without vertical drift. Right upper and lower extremities 4/5 strength with some vertical drift on assessment. Right grip strength 4/5 weaker than left grip strength- 5/5. Tone is normal. Bulk is normal.  Sensation: Patient endorses a tingling sensation to the right upper and lower extremity and face but sensation to light touch is absent on the right upper and lower extremity throughout.  Coordination: FTN intact bilaterally. HKS intact on the left.  DTRs: 2+ and symmetric biceps Gait: deferred  NIHSS: 1a Level of Conscious.: 0 1b LOC Questions: 0 1c LOC Commands: 0 2 Best Gaze: 0 3 Visual: 0 4 Facial Palsy: 0 5a Motor Arm - left: 0 5b Motor Arm - Right: 1 6a Motor Leg - Left: 0 6b Motor Leg - Right: 1 7 Limb Ataxia: 0 8 Sensory: 2 9 Best Language: 0 10 Dysarthria: 0 11 Extinct. and Inatten.: 0 TOTAL: 4  Labs I have reviewed labs in epic and the results pertinent to this consultation are: CBC    Component Value Date/Time   WBC 6.0 07/05/2020 1736   RBC 4.85 07/05/2020 1736   HGB 15.0 07/05/2020 1747   HCT 44.0 07/05/2020 1747   PLT 322 07/05/2020 1736   MCV 88.5 07/05/2020 1736   MCH 29.3 07/05/2020 1736   MCHC 33.1 07/05/2020 1736   RDW 12.1 07/05/2020 1736   LYMPHSABS 1.9 07/05/2020 1736   MONOABS 0.6 07/05/2020 1736   EOSABS 0.1 07/05/2020 1736   BASOSABS 0.0 07/05/2020 1736   CMP     Component Value Date/Time   NA 141 07/05/2020 1747   K 3.4 (L) 07/05/2020 1747   CL 104 07/05/2020 1747   CO2 28 07/05/2020 1736   GLUCOSE 95 07/05/2020 1747    BUN 9 07/05/2020 1747   CREATININE 0.80 07/05/2020 1747   CALCIUM 9.2 07/05/2020 1736   PROT 8.2 (H) 07/05/2020 1736   ALBUMIN 4.2 07/05/2020 1736   AST 19 07/05/2020 1736   ALT 14 07/05/2020 1736   ALKPHOS 82 07/05/2020 1736   BILITOT 0.8 07/05/2020 1736   GFRNONAA >60 07/05/2020 1736   Lipid Panel  No results found for: CHOL, TRIG, HDL, CHOLHDL, VLDL, LDLCALC, LDLDIRECT No results found for: HGBA1C   Imaging I have reviewed the images obtained:  CT-scan of the brain 07/05/2020: No evidence of acute intracranial abnormality.  ASPECTS is 10.  CT angio head and neck / CT cerebral perfusion 07/05/2020: CTA neck: The common carotid, internal carotid and vertebral arteries are patent within the neck without stenosis.   CTA head: No intracranial large vessel occlusion or proximal high-grade arterial stenosis identified.   CT perfusion head: Inadequate contrast bolus or contrast timing, limiting reliability of the perfusion data. Within this limitation, the perfusion software identifies no core infarct or critically hypoperfused parenchyma.  Assessment: 20 y.o. with  PMHx of tobacco use who presented via POV with right hemibody weakness and numbness who was activated as a Code Stroke on arrival. - Examination reveals right upper and lower extremity weakness with vertical drift on assessment and right hemibody numbness. She also endorses a tingling sensation to the right upper and lower extremity. - CT imaging was without acute intracranial abnormality or hemorrhage, CTA imaging without large vessel occlusion, and CTP without core infarct or critical hypoperfusion but imaging was limited by inadequate contrast bolus or contrast timing.  - The tPA checklist was gone over with patient and tPA treatment was discussed with patient, including the risks and benefits of thrombolytic therapy. She was initially hesitant about treatment and the tPA start time was delayed until she could reach her  mother by telephone.  - Presentation most consistent with acute ischemic stroke diagnosed by clinical assessment. Etiology unclear at this time. Will admit patient for further stroke work up and MR venogram imaging.   Impression: Acute ischemic stroke diagnosed by clinical assessment  Plan: Acute Ischemic Stroke  Acuity: Acute Current Suspected Etiology: Unclear Continue Evaluation:  -Admit to: ICU, may reevaluate 2 hours s/p tPA to determine if appropriate for low-intensity monitoring -Hold Aspirin until 24 hour post tPA neuroimaging is stable and without evidence of bleeding -Blood pressure control, goal of SYS < 180 -MRI/ECHO/A1C/Lipid panel, MR venogram -Hyperglycemia management per SSI to maintain glucose 140-180mg /dL. 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 side  -PT/OT -PM&R consult  RESP - Maintain SpO2 > 92% - Oxygen as needed  CV Adequate blood pressure on arrival -Goal SBP < 180  AM CMP -If hyperlipidemia initiate statin therapy for goal LDL < 70  HEME Hemoglobin 16.114.2 on admission -Monitor AM CBC -Transfuse for hgb < 7  ENDO No history of diabetes -goal HgbA1c < 7%  GI/GU Nausea and Vomiting PTA No GU complaints -Gentle hydration  Fluid/Electrolyte Disorders -AM CMP -Trend -Replete as needed  ID No indication of infection on arrival -Trend fevers and leukocytosis -AM CBC  Nutrition -NPO until pass swallow evaluation -Advance diet as tolerated to goal regular diet  Prophylaxis DVT:  SCDs GI: PPI Bowel: Docusate / Senna  Diet: NPO until cleared by speech  Code Status: Full Code   THE FOLLOWING WERE PRESENT ON ADMISSION: CNS -  Hemiparesis, R Sensory deficit- numbness Respiratory - N/A Cardiovascular - N/A Infectious - N/A GI - N/A Renal -  N/A Heme-  N/A Cancer - N/A Trauma - N/A,   Lanae BoastStevi Toberman, AGAC-NP [facility] Pager: [phone]  NEUROHOSPITALIST ADDENDUM Performed a face to face diagnostic evaluation.   I have reviewed the contents of history and physical exam as documented by PA/ARNP/Resident and agree with above documentation.  I have discussed and formulated the above plan as documented. Edits to the note have been made as needed.  Impression/Key exam findings/Plan: young female with sudden onset R sided weakness and numbness. CTH with no ICH. Was offered tPA at 1752 but asked to hold off so she could talk to her mother first. Agreeable to tPA at 1802. CTA head and neck with no LVO. NIHSS of 4.  Will get hypercoag panel along with MR Venogram in addition to standard stroke workup.  This patient is critically ill and at significant risk of neurological worsening, death and care requires constant monitoring of vital signs, hemodynamics,respiratory and cardiac monitoring, neurological assessment, discussion with family, other specialists and medical decision  making of high complexity. I spent 60 minutes of neurocritical care time  in the care of  this patient. This was time spent independent of any time provided by nurse practitioner or PA.  Erick Blinks [facility] Pager Number [phone] 07/05/2020  7:21 PM   Erick Blinks, MD [facility] [phone]   If 7pm to 7am, please call on call as listed on AMION.

## 2020-07-06 ENCOUNTER — Inpatient Hospital Stay (HOSPITAL_COMMUNITY): Payer: Medicaid Other

## 2020-07-06 DIAGNOSIS — I6389 Other cerebral infarction: Secondary | ICD-10-CM

## 2020-07-06 LAB — RAPID URINE DRUG SCREEN, HOSP PERFORMED
Amphetamines: NOT DETECTED
Barbiturates: NOT DETECTED
Benzodiazepines: NOT DETECTED
Cocaine: NOT DETECTED
Opiates: NOT DETECTED
Tetrahydrocannabinol: POSITIVE — AB

## 2020-07-06 LAB — LIPID PANEL
Cholesterol: 152 mg/dL (ref 0–200)
HDL: 54 mg/dL (ref >40)
LDL Cholesterol: 88 mg/dL (ref 0–99)
Total CHOL/HDL Ratio: 2.8 RATIO
Triglycerides: 48 mg/dL (ref <150)
VLDL: 10 mg/dL (ref 0–40)

## 2020-07-06 LAB — CBC
HCT: 39.7 % (ref 36.0–46.0)
Hemoglobin: 13.7 g/dL (ref 12.0–15.0)
MCH: 29.7 pg (ref 26.0–34.0)
MCHC: 34.5 g/dL (ref 30.0–36.0)
MCV: 86.1 fL (ref 80.0–100.0)
Platelets: 304 10*3/uL (ref 150–400)
RBC: 4.61 MIL/uL (ref 3.87–5.11)
RDW: 12.1 % (ref 11.5–15.5)
WBC: 5.2 10*3/uL (ref 4.0–10.5)
nRBC: 0 % (ref 0.0–0.2)

## 2020-07-06 LAB — COMPREHENSIVE METABOLIC PANEL
ALT: 8 U/L (ref 0–44)
AST: 20 U/L (ref 15–41)
Albumin: 3.3 g/dL — ABNORMAL LOW (ref 3.5–5.0)
Alkaline Phosphatase: 65 U/L (ref 38–126)
Anion gap: 8 (ref 5–15)
BUN: 8 mg/dL (ref 6–20)
CO2: 25 mmol/L (ref 22–32)
Calcium: 9.1 mg/dL (ref 8.9–10.3)
Chloride: 105 mmol/L (ref 98–111)
Creatinine, Ser: 0.79 mg/dL (ref 0.44–1.00)
GFR, Estimated: >60 mL/min (ref >60)
Glucose, Bld: 88 mg/dL (ref 70–99)
Potassium: 3.6 mmol/L (ref 3.5–5.1)
Sodium: 138 mmol/L (ref 135–145)
Total Bilirubin: 1.5 mg/dL — ABNORMAL HIGH (ref 0.3–1.2)
Total Protein: 6.6 g/dL (ref 6.5–8.1)

## 2020-07-06 LAB — URINALYSIS, COMPLETE (UACMP) WITH MICROSCOPIC
Bilirubin Urine: NEGATIVE
Glucose, UA: NEGATIVE mg/dL
Ketones, ur: NEGATIVE mg/dL
Leukocytes,Ua: NEGATIVE
Nitrite: NEGATIVE
Protein, ur: NEGATIVE mg/dL
Specific Gravity, Urine: 1.025 (ref 1.005–1.030)
pH: 6 (ref 5.0–8.0)

## 2020-07-06 LAB — PREGNANCY, URINE: Preg Test, Ur: NEGATIVE

## 2020-07-06 LAB — ECHOCARDIOGRAM COMPLETE BUBBLE STUDY
Area-P 1/2: 3.88 cm2
Calc EF: 63.7 %
S' Lateral: 2.9 cm
Single Plane A2C EF: 67 %
Single Plane A4C EF: 56.4 %

## 2020-07-06 LAB — HEMOGLOBIN A1C
Hgb A1c MFr Bld: 4.7 % — ABNORMAL LOW (ref 4.8–5.6)
Mean Plasma Glucose: 88.19 mg/dL

## 2020-07-06 LAB — HIV ANTIBODY (ROUTINE TESTING W REFLEX): HIV Screen 4th Generation wRfx: NONREACTIVE

## 2020-07-06 IMAGING — MR MR [PERSON_NAME] HEAD
4 of 5 series · 19 of 48 positions shown · IV contrast (gadavist)
Comparison: Head CTA [DATE] and head MRI [DATE]

CLINICAL DATA: Stroke follow-up. Right-sided weakness and numbness.

EXAM:
MR VENOGRAM HEAD WITHOUT AND WITH CONTRAST
TECHNIQUE: Angiographic images of the intracranial venous structures were
acquired using MRV technique without and with intravenous contrast.
CONTRAST:  6mL GADAVIST GADOBUTROL 1 MMOL/ML IV SOLN

[Series 4: MRV · coronal · 1.5mm · 0.43mm/px · 6 of 129 slices shown]
[im 1/129]
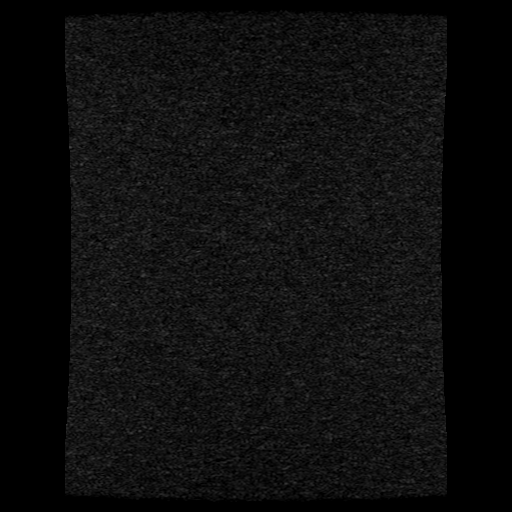
[im 26/129]
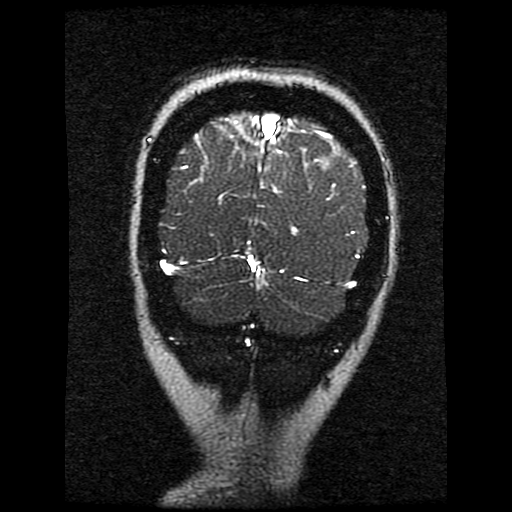
[im 52/129]
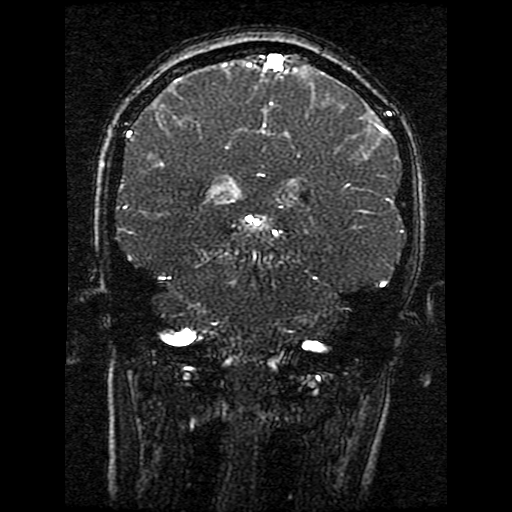
[im 77/129]
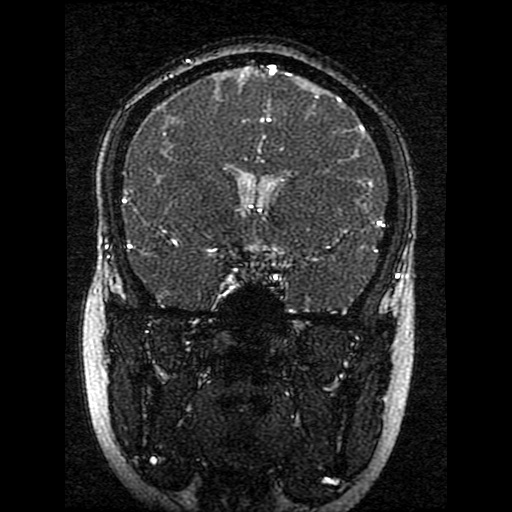
[im 103/129]
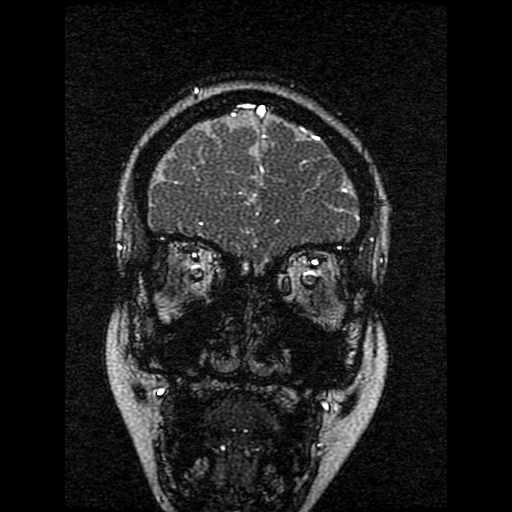
[im 129/129]
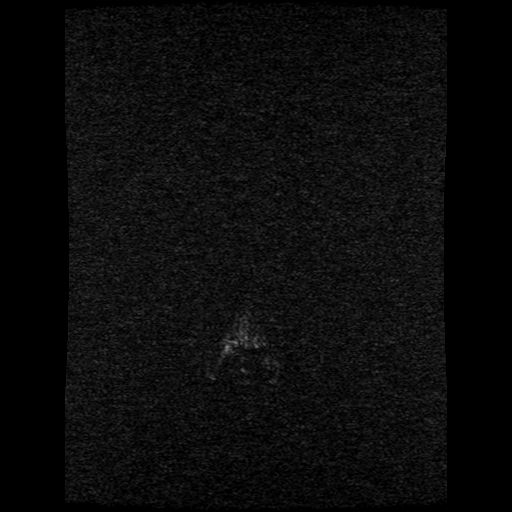

[Series 5: sag inhance (id) · sagittal · 1.8mm · 0.47mm/px · 7 of 324 slices shown]
[im 1/324]
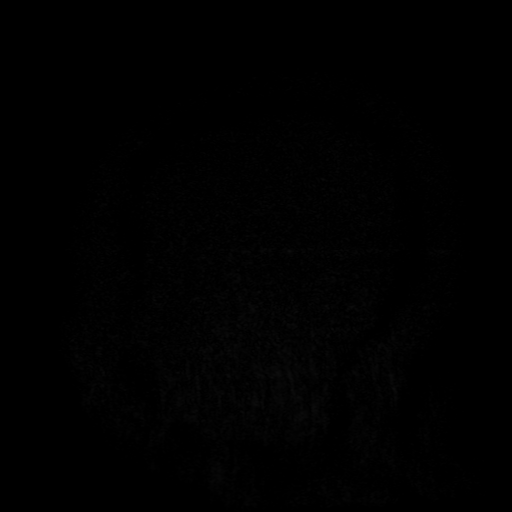
[im 47/324]
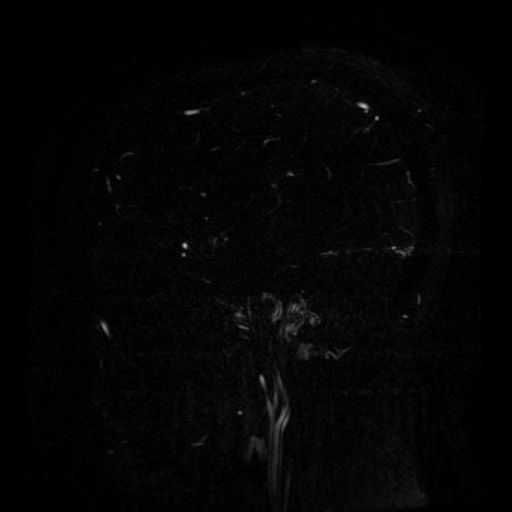
[im 93/324]
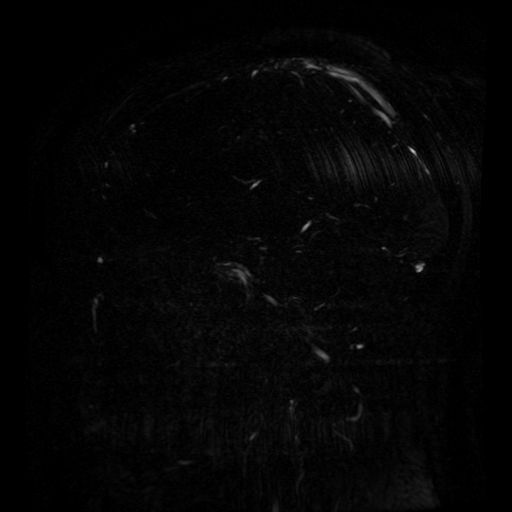
[im 139/324]
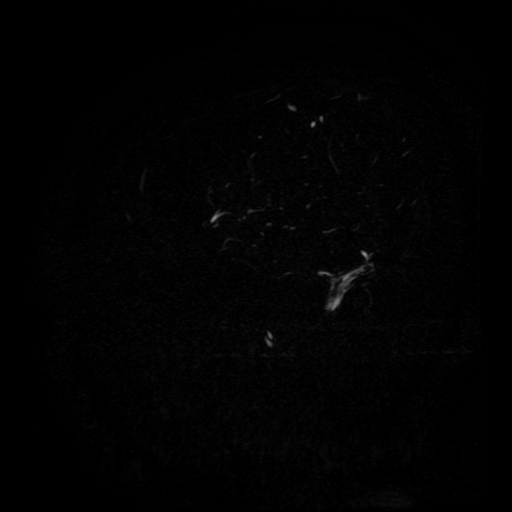
[im 162/324]
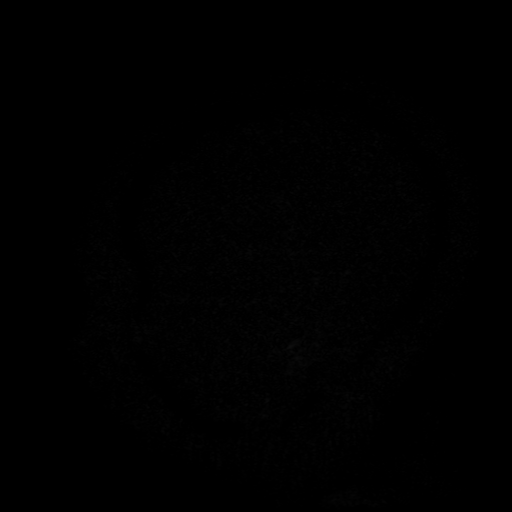
[im 185/324]
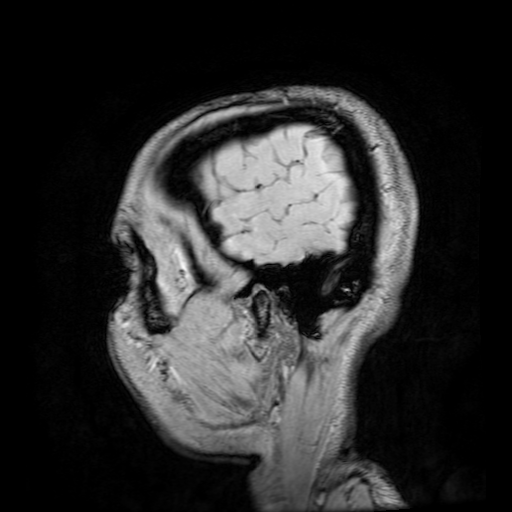
[im 277/324]
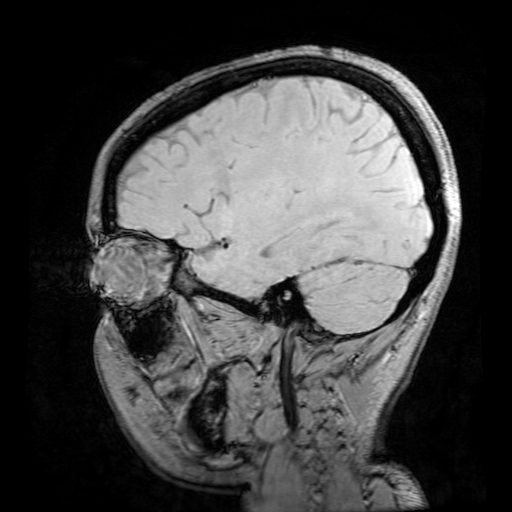

[Series 1300: multiplanar reconstruction (mpr) · sagittal · 0.9mm · 0.47mm/px · 3 of 204 slices shown (1 of 2)]
[im 26/204]
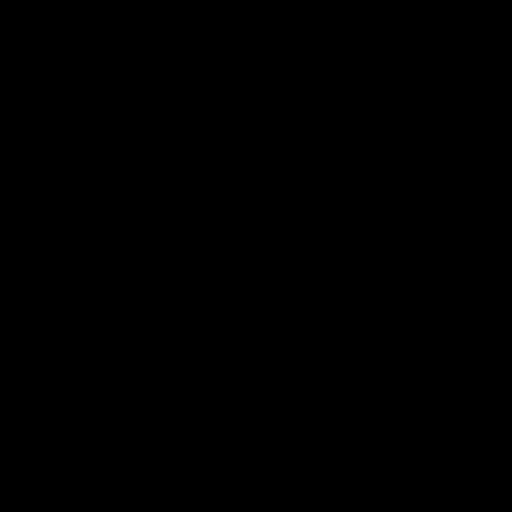
[im 102/204]
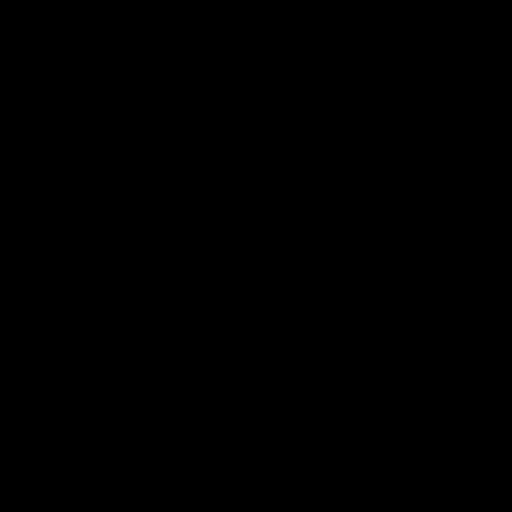
[im 178/204]
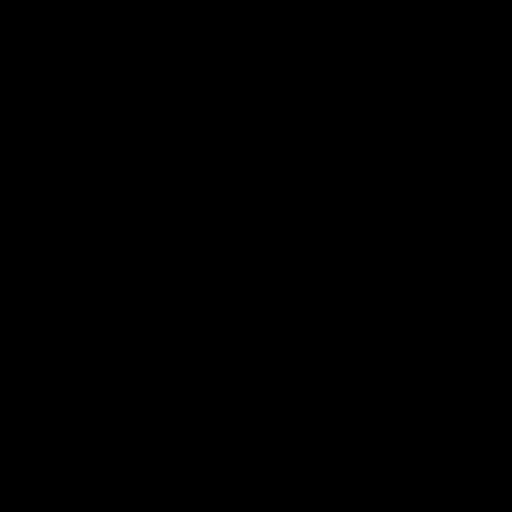

[Series 1301: multiplanar reconstruction (mpr) · coronal · 0.9mm · 0.47mm/px · 3 of 255 slices shown (2 of 2)]
[im 26/255]
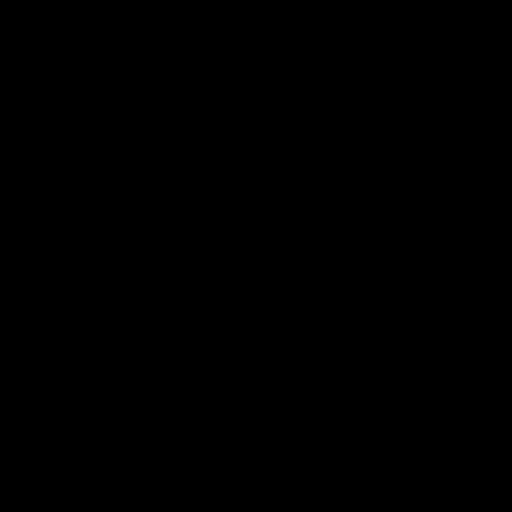
[im 128/255]
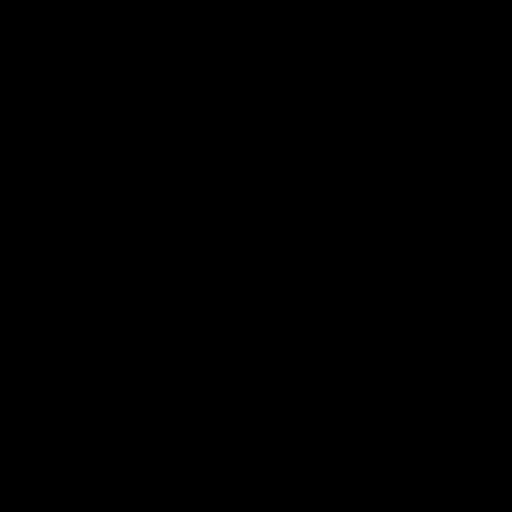
[im 229/255]
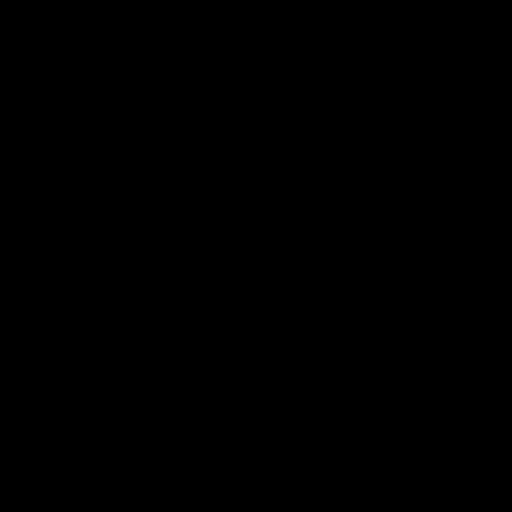

[19 of 48 positions shown; findings below may reference images not displayed]

FINDINGS: The superior sagittal sinus, internal cerebral veins, vein of KARTHIKEYAN,
straight sinus, transverse sinuses, sigmoid sinuses, and jugular
bulbs are patent without convincing thrombus. The right transverse
and sigmoid sinuses are mildly dominant.

There is moderate multifocal narrowing of the midportion of the
superior sagittal sinus with the appearance of both internal filling
defects as well as compression by extrinsic structures on source
images. Comparison with today's brain MRI demonstrates multiple
lobular T2 hyperintense structures on both sides of the sinus
compressing and extending into the sinus, and the overall appearance
is most consistent with multiple prominent arachnoid granulations.
IMPRESSION: 1. No evidence of dural venous sinus thrombosis.
2. Moderate multifocal narrowing of the superior sagittal sinus
secondary to prominent arachnoid granulations.

## 2020-07-06 IMAGING — MR MR HEAD W/O CM
7 of 11 series · 25 of 48 positions shown · non-contrast
Comparison: Head CT, CTA, and CTP [DATE]

CLINICAL DATA: Stroke follow-up. Right-sided weakness and numbness.

EXAM:
MRI HEAD WITHOUT CONTRAST
TECHNIQUE: Multiplanar, multiecho pulse sequences of the brain and surrounding
structures were obtained without intravenous contrast.

[Series 2: DWI · axial · 3.0mm · 0.94mm/px · z∈[-87,+75]mm · 7 of 110 slices shown (1 of 2)]
[im 1/110]
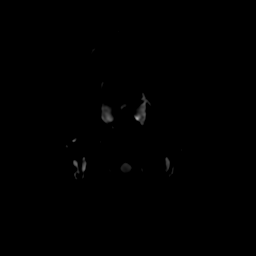
[im 19/110]
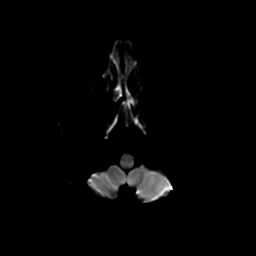
[im 37/110]
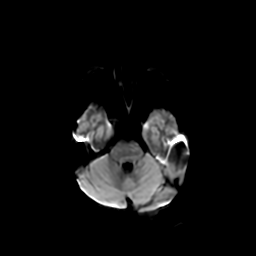
[im 55/110]
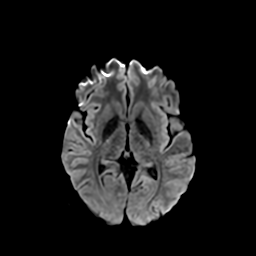
[im 73/110]
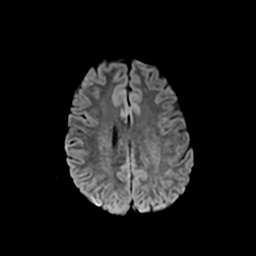
[im 91/110]
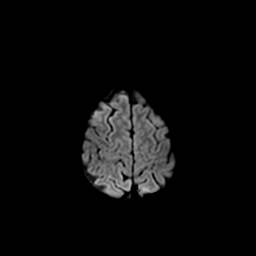
[im 110/110]
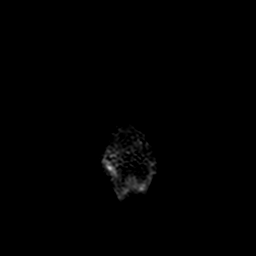

[Series 6: DWI · coronal · 4.0mm · 0.94mm/px · 5 of 76 slices shown (2 of 2)]
[im 1/76]
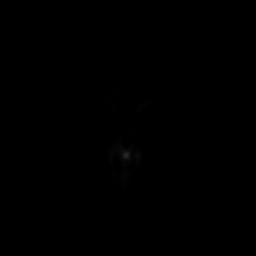
[im 19/76]
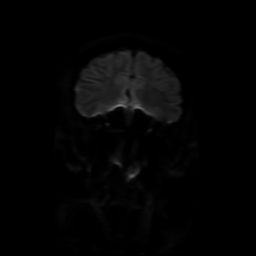
[im 38/76]
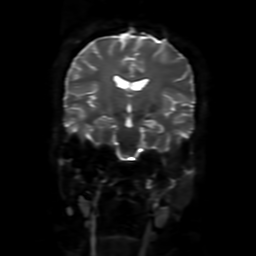
[im 57/76]
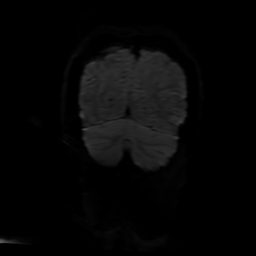
[im 76/76]
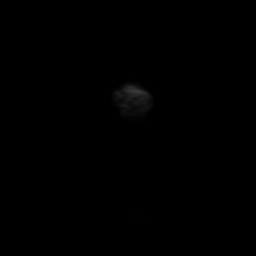

[Series 7: FLAIR · sagittal · 5.0mm · 0.23mm/px · 2 of 23 slices shown (1 of 2)]
[im 1/23]
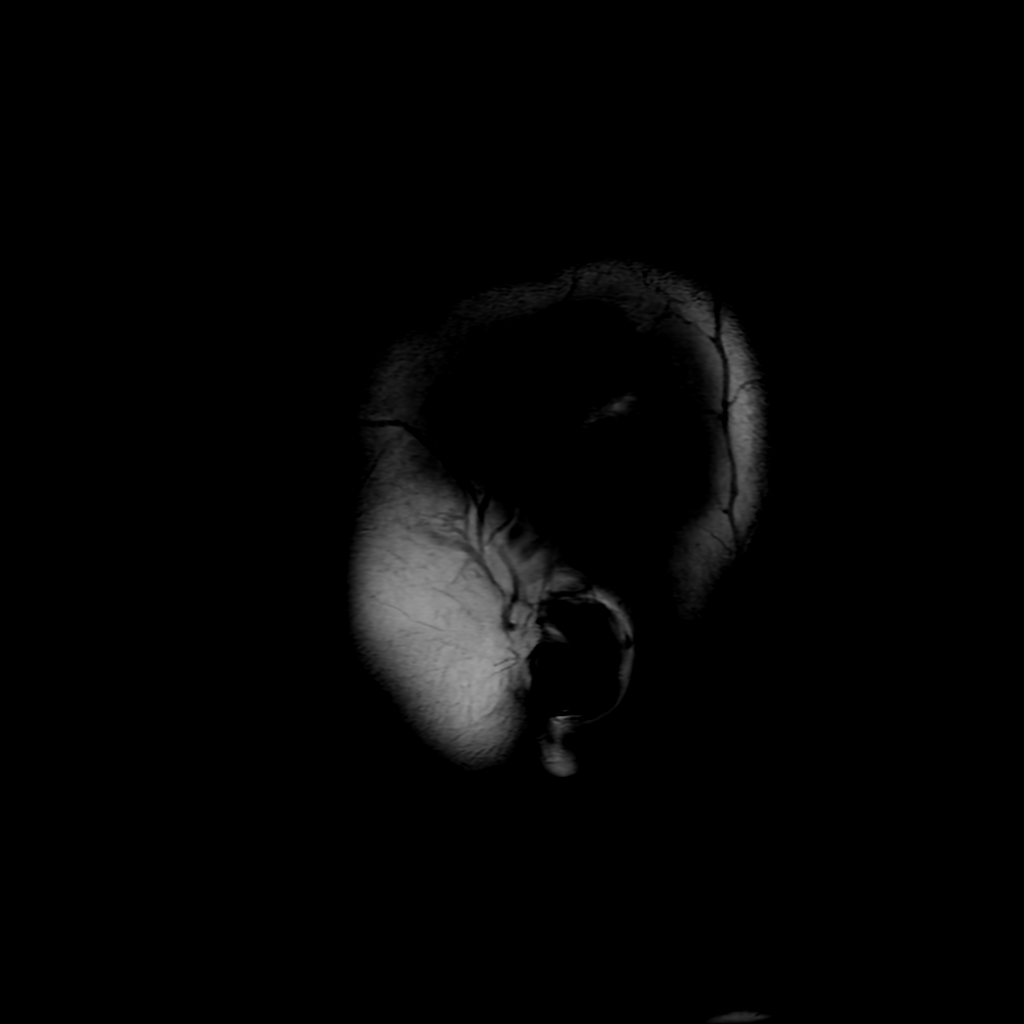
[im 23/23]
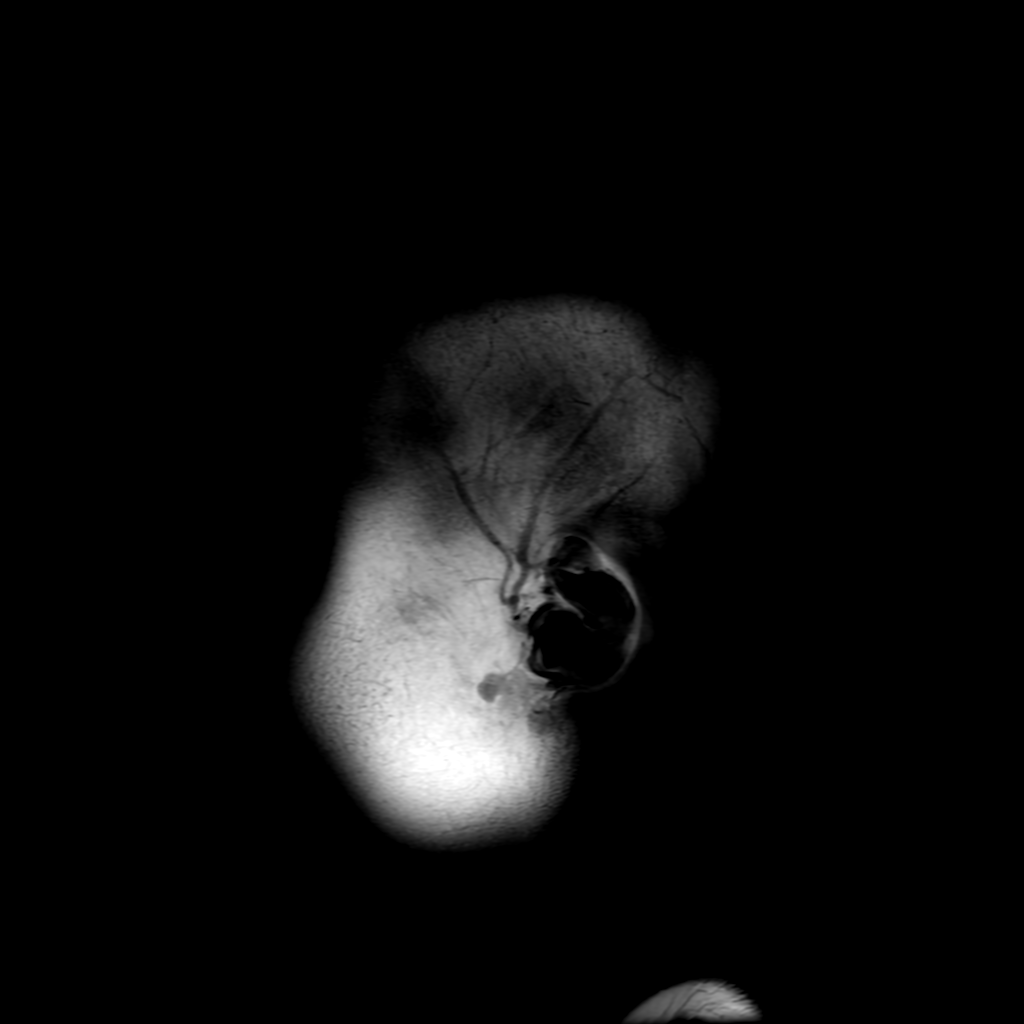

[Series 8: T2 · axial · 5.0mm · 0.23mm/px · 1 of 26 slices shown]
[im 1/26]
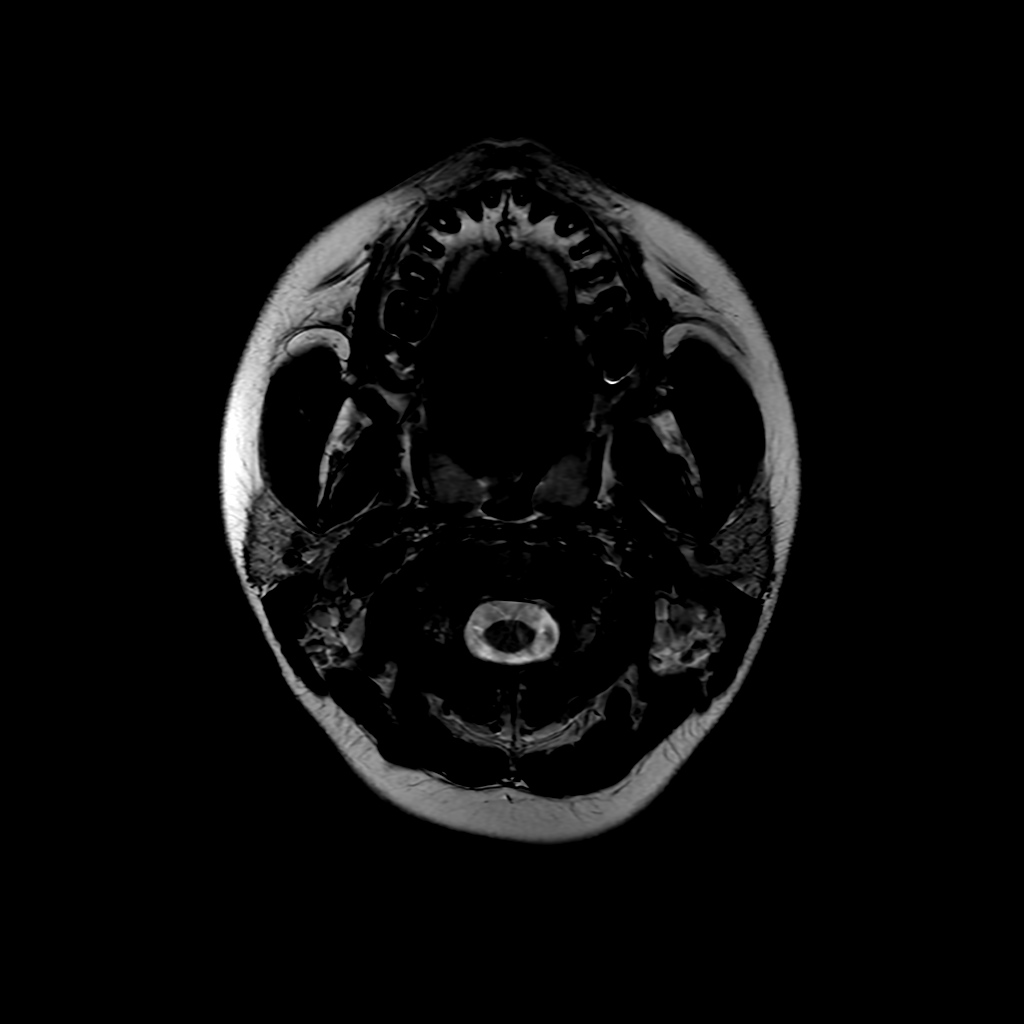

[Series 9: FLAIR · axial · 4.0mm · 0.47mm/px · z∈[-73,+76]mm · 3 of 35 slices shown (2 of 2)]
[im 1/35]
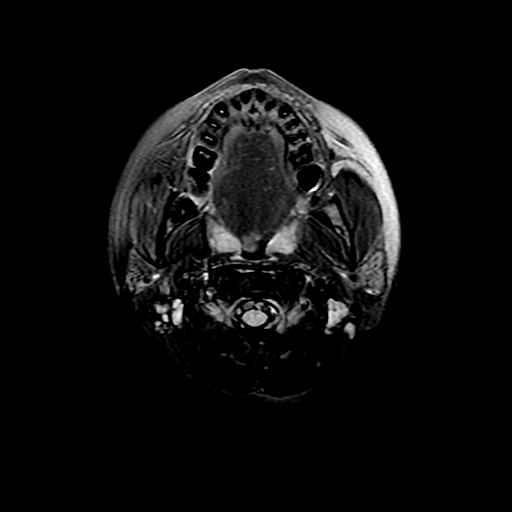
[im 18/35]
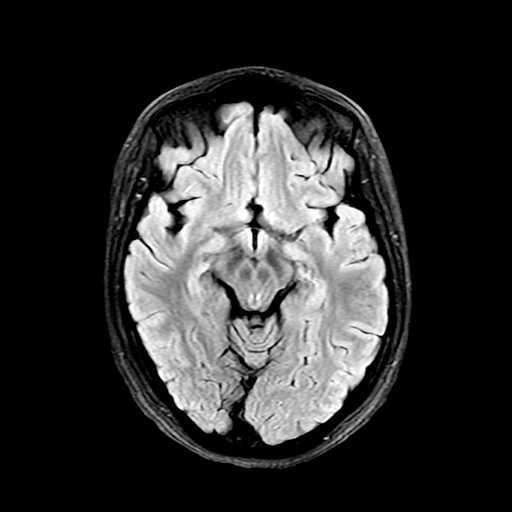
[im 35/35]
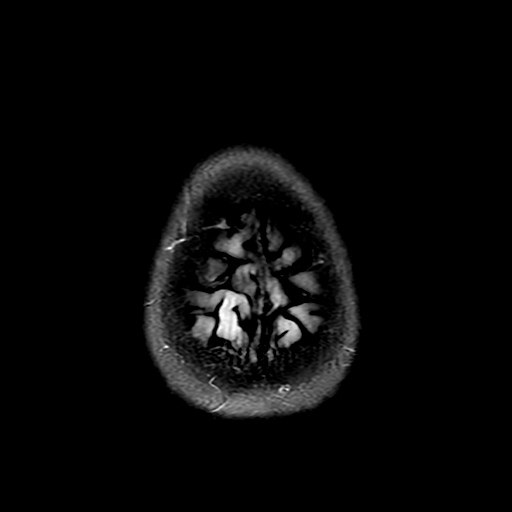

[Series 250: ADC · axial · 3.0mm · 0.94mm/px · z∈[-87,+75]mm · 4 of 55 slices shown (1 of 2)]
[im 1/55]
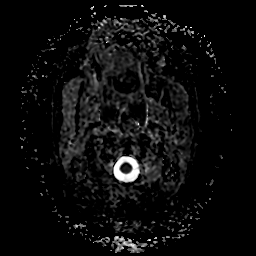
[im 19/55]
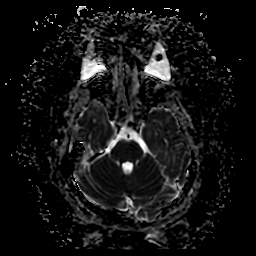
[im 37/55]
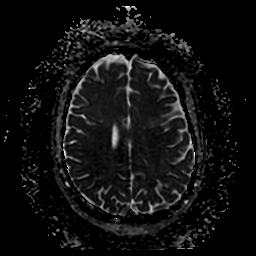
[im 55/55]
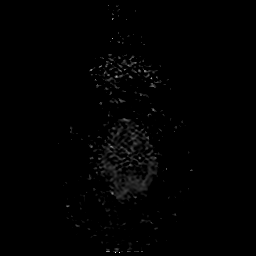

[Series 650: ADC · coronal · 4.0mm · 0.94mm/px · 3 of 38 slices shown (2 of 2)]
[im 1/38]
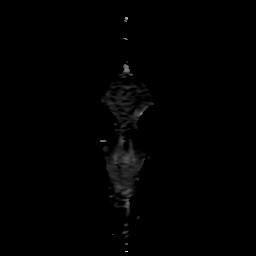
[im 19/38]
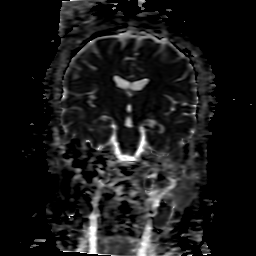
[im 38/38]
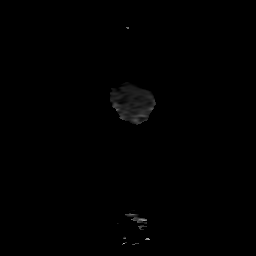

[25 of 48 positions shown; findings below may reference images not displayed]

FINDINGS: Brain: There is no evidence of an acute infarct, intracranial
hemorrhage, mass, midline shift, or extra-axial fluid collection.
The ventricles and sulci are normal. The brain is normal in signal.

Vascular: Major intracranial vascular flow voids are preserved.

Skull and upper cervical spine: Unremarkable bone marrow signal.

Sinuses/Orbits: Unremarkable orbits. Mild mucosal thickening in the
paranasal sinuses. Trace right mastoid fluid.

Other: None.
IMPRESSION: Unremarkable appearance of the brain.

## 2020-07-06 MED ORDER — PROCHLORPERAZINE EDISYLATE 10 MG/2ML IJ SOLN
10.0000 mg | Freq: Once | INTRAMUSCULAR | Status: AC
  Administered 2020-07-06: 10 mg via INTRAVENOUS
  Filled 2020-07-06: qty 2

## 2020-07-06 MED ORDER — IBUPROFEN 200 MG PO TABS
400.0000 mg | ORAL_TABLET | Freq: Once | ORAL | Status: AC
  Administered 2020-07-06: 400 mg via ORAL
  Filled 2020-07-06: qty 2

## 2020-07-06 MED ORDER — LORAZEPAM 2 MG/ML IJ SOLN
1.0000 mg | Freq: Once | INTRAMUSCULAR | Status: AC
  Administered 2020-07-06: 1 mg via INTRAVENOUS
  Filled 2020-07-06: qty 1

## 2020-07-06 MED ORDER — GADOBUTROL 1 MMOL/ML IV SOLN
6.0000 mL | Freq: Once | INTRAVENOUS | Status: AC | PRN
  Administered 2020-07-06: 6 mL via INTRAVENOUS

## 2020-07-06 NOTE — Evaluation (Signed)
Physical Therapy Evaluation & Discharge Patient Details Name: Katherine Sparks MRN: [id number] DOB: September 02, 2000 Today's Date: 07/06/2020   History of Present Illness  This 20 y.o. female admitted 6/23 with acute onset Rt sided weakness and numbness as well as sudden onset N/V.  tPA administered.  CT, CTA, and MRI of head negative for acute event. PMH: tobacco use   Clinical Impression  Pt presents with condition above. PTA, she was living in a ground level apartment with her significant other and 21 y.o. Pt was independent and working at [employer] as a Diplomatic Services operational officer. Pt appears to be close to her baseline, performing all functional mobility without physical assistance. Initially, pt displayed some instability with standing and a decreased gait speed but as distance progressed her speed, balance, and gait pattern improved. Pt is not at risk for falls, supported by her DGI score of 23 this date. Her imbalance initially was likely secondary to her headache and feelings of lightheadedness (BP stable throughout). Pt does display some weakness in her R UE and R lower extremity proximally, but expect this to improve quickly. All education completed and questions answered. PT will sign off.    Follow Up Recommendations No PT follow up    Equipment Recommendations  None recommended by PT    Recommendations for Other Services       Precautions / Restrictions Precautions Precautions: None Restrictions Weight Bearing Restrictions: No      Mobility  Bed Mobility Overal bed mobility: Independent             General bed mobility comments: Pt able to transition supine <> sit EOB with bed flat and rails down safely in appropriate time frame. Pt climbs into bed forwards by standing on one leg and curling other leg entering bed under her buttocks.    Transfers Overall transfer level: Needs assistance   Transfers: Sit to/from Stand Sit to Stand: Supervision         General transfer  comment: Supervision for safety, mild trunk sway noted initially coming to stand.  Ambulation/Gait Ambulation/Gait assistance: Supervision Gait Distance (Feet): 450 Feet Assistive device: None Gait Pattern/deviations: WFL(Within Functional Limits) Gait velocity: WNL Gait velocity interpretation: >4.37 ft/sec, indicative of normal walking speed (with increased gait distance and cued to increase speed) General Gait Details: No significant gait deviations or LOB. Pt initially ambulating slowly with mild trunk sway/instability, but this resolved and her speed increased as distance progressed.  Stairs Stairs: Yes Stairs assistance: Min guard Stair Management: No rails;Alternating pattern;Step to pattern;Forwards Number of Stairs: 10 General stair comments: Ascends with no rails and reciprocal pattern, no LOB. Descends alternating between step-to and reciprocal pattern with hesitation and slight instability noted but no LOB, no use of rails. Increased speed and stability with subsequent stairs descending. Min guard for safety.  Wheelchair Mobility    Modified Rankin (Stroke Patients Only) Modified Rankin (Stroke Patients Only) Pre-Morbid Rankin Score: No symptoms Modified Rankin: No significant disability     Balance Overall balance assessment: No apparent balance deficits (not formally assessed) (as session progress, so did her balance)                               Standardized Balance Assessment Standardized Balance Assessment : Dynamic Gait Index   Dynamic Gait Index Level Surface: Normal Change in Gait Speed: Normal Gait with Horizontal Head Turns: Mild Impairment Gait with Vertical Head Turns: Normal Gait and Pivot Turn:  Normal Step Over Obstacle: Normal Step Around Obstacles: Normal Steps: Normal Total Score: 23       Pertinent Vitals/Pain Pain Assessment: 0-10 Pain Score: 8  Pain Location: headache Pain Descriptors / Indicators:  Headache;Heaviness Pain Intervention(s): Limited activity within patient's tolerance;Monitored during session;Repositioned    Home Living Family/patient expects to be discharged to:: Private residence Living Arrangements: Spouse/significant other;Children (2 y.o. daughter) Available Help at Discharge: Family;Available PRN/intermittently Type of Home: Apartment (ground floor) Home Access: Level entry     Home Layout: One level Home Equipment: Grab bars - tub/shower      Prior Function Level of Independence: Independent         Comments: Works at [employer]. Pt drives.     Hand Dominance   Dominant Hand: Left    Extremity/Trunk Assessment   Upper Extremity Assessment Upper Extremity Assessment: RUE deficits/detail RUE Deficits / Details: Slight weakness more proximally compared to L side; MMT scores of the following: shoulder flexion 4, shoulder abduction 4, elbow flexion 5, elbow extension 5, grip WNL (L MMT grossly 5 throughout) RUE Sensation: WNL RUE Coordination: WNL    Lower Extremity Assessment Lower Extremity Assessment: RLE deficits/detail RLE Deficits / Details: Slight weakness more proximally compared to L side; MMT of the following: hip flexion 4, knee extension 4+, knee flexion 4+, ankle dorsiflexion 5 (L MMT grossly 4+ to 5 throughout) RLE Sensation: WNL RLE Coordination: WNL    Cervical / Trunk Assessment Cervical / Trunk Assessment: Normal  Communication   Communication: No difficulties  Cognition Arousal/Alertness: Awake/alert Behavior During Therapy: WFL for tasks assessed/performed Overall Cognitive Status: Within Functional Limits for tasks assessed                                 General Comments: A&Ox4. difficulty counting backwards by 3s and performing basic math, states this is her baseline      General Comments General comments (skin integrity, edema, etc.): Pt able to maintain the following stances with eyes open and  closed > 5 sec with either leg in lead: Rhomberg, semi-tandem, tandem, single leg stance; HR up to 150 with descending stairs; smooth tracking with eyes, denies changes in vision, intact bil peripheral vision    Exercises     Assessment/Plan    PT Assessment Patent does not need any further PT services  PT Problem List         PT Treatment Interventions      PT Goals (Current goals can be found in the Care Plan section)  Acute Rehab PT Goals Patient Stated Goal: to get better PT Goal Formulation: With patient Time For Goal Achievement: 07/07/20 Potential to Achieve Goals: Good    Frequency     Barriers to discharge        Co-evaluation               AM-PAC PT "6 Clicks" Mobility  Outcome Measure Help needed turning from your back to your side while in a flat bed without using bedrails?: None Help needed moving from lying on your back to sitting on the side of a flat bed without using bedrails?: None Help needed moving to and from a bed to a chair (including a wheelchair)?: A Little Help needed standing up from a chair using your arms (e.g., wheelchair or bedside chair)?: A Little Help needed to walk in hospital room?: A Little Help needed climbing 3-5 steps with a railing? :  A Little 6 Click Score: 20    End of Session Equipment Utilized During Treatment: Gait belt Activity Tolerance: Patient tolerated treatment well Patient left: in bed;with call bell/phone within reach;with bed alarm set Nurse Communication: Mobility status PT Visit Diagnosis: Muscle weakness (generalized) (M62.81)    Time: 7106-2694 PT Time Calculation (min) (ACUTE ONLY): 40 min   Charges:   PT Evaluation $PT Eval Low Complexity: 1 Low PT Treatments $Gait Training: 8-22 mins $Therapeutic Activity: 8-22 mins        Raymond Gurney, PT, DPT Acute Rehabilitation Services  Pager: [phone] Office: [phone]   Jewel Baize 07/06/2020, 3:57 PM

## 2020-07-06 NOTE — Progress Notes (Signed)
  Echocardiogram 2D Echocardiogram has been performed.  Janalyn Harder 07/06/2020, 2:37 PM

## 2020-07-06 NOTE — Progress Notes (Signed)
PT Cancellation Note  Patient Details Name: Katherine Sparks MRN: [id number] DOB: 29-Apr-2000   Cancelled Treatment:    Reason Eval/Treat Not Completed: Active bedrest order. tPA administered at 18:03 on 07/05/20. Will follow-up as time permits and as medically appropriate.   Raymond Gurney, PT, DPT Acute Rehabilitation Services  Pager: [phone] Office: [phone]    Jewel Baize 07/06/2020, 7:26 AM

## 2020-07-06 NOTE — Progress Notes (Signed)
  Speech Language Pathology Treatment:    Patient Details Name: Katherine Sparks MRN: [id number] DOB: Jul 02, 2000 Today's Date: 07/06/2020 Time:  -     Therapist spoke with pt who reported difficulty getting words out yesterday when episode started which has cleared now. Her speech and cognition appear intact and does not warrant further assessment. Pt in agreement.       Royce Macadamia 07/06/2020, 11:03 AM  Breck Coons Lonell Face.Ed Nurse, children's [phone] Office [phone]'

## 2020-07-06 NOTE — Progress Notes (Addendum)
Patient has patent foramen ovale per Sheralyn Boatman echo tech. Dr. Pearlean Brownie paged at [phone].  I have not heard back and wanted to make sure a provider knew so I paged Cephus Richer, NP at [phone] as well.

## 2020-07-06 NOTE — Progress Notes (Addendum)
STROKE TEAM PROGRESS NOTE   INTERVAL HISTORY Her RN is at the bedside. Stroke work up underway. Symptoms have now resolved.  Patient states that she had gradually progressive fleeting numbness starting in the right face and cheek and then moving into the right neck, upper extremity followed by lower extremity.  She denies accompanying headache but does have history of infrequent migraines.  Symptoms resolved a couple of hours after receiving tPA.  She has no residual deficit this morning.  Blood pressure is adequately controlled.  She has no obvious known stroke risk factors.  She does take estrogen progesterone injections for amenorrhoea  Vitals:   07/06/20 0600 07/06/20 0700 07/06/20 0800 07/06/20 0805  BP: (!) 100/58 (!) 88/54 (!) 74/59 103/64  Pulse: 66 (!) 49 67 70  Resp: (!) 27 17 17 18   Temp:   97.8 F (36.6 C)   TempSrc:   Oral   SpO2: 99% 98% 98% 99%  Weight:       CBC:  Recent Labs  Lab 07/05/20 1736 07/05/20 1747 07/06/20 0435  WBC 6.0  --  5.2  NEUTROABS 3.3  --   --   HGB 14.2 15.0 13.7  HCT 42.9 44.0 39.7  MCV 88.5  --  86.1  PLT 322  --  304   Basic Metabolic Panel:  Recent Labs  Lab 07/05/20 1736 07/05/20 1747 07/06/20 0435  NA 137 141 138  K 3.3* 3.4* 3.6  CL 105 104 105  CO2 28  --  25  GLUCOSE 98 95 88  BUN 9 9 8   CREATININE 0.81 0.80 0.79  CALCIUM 9.2  --  9.1   Lipid Panel:  Recent Labs  Lab 07/06/20 0435  CHOL 152  TRIG 48  HDL 54  CHOLHDL 2.8  VLDL 10  LDLCALC 88   HgbA1c:  Recent Labs  Lab 07/06/20 0435  HGBA1C 4.7*   Urine Drug Screen: No results for input(s): LABOPIA, COCAINSCRNUR, LABBENZ, AMPHETMU, THCU, LABBARB in the last 168 hours.  Alcohol Level  Recent Labs  Lab 07/05/20 1736  ETH <10    IMAGING past 24 hours CT CEREBRAL PERFUSION W CONTRAST  Result Date: 07/05/2020 CLINICAL DATA:  Stroke/TIA, assess intracranial arteries. Stroke/TIA, assess extracranial arteries. EXAM: CT ANGIOGRAPHY HEAD AND NECK CT PERFUSION  BRAIN TECHNIQUE: Multidetector CT imaging of the head and neck was performed using the standard protocol during bolus administration of intravenous contrast. Multiplanar CT image reconstructions and MIPs were obtained to evaluate the vascular anatomy. Carotid stenosis measurements (when applicable) are obtained utilizing NASCET criteria, using the distal internal carotid diameter as the denominator. Multiphase CT imaging of the brain was performed following IV bolus contrast injection. Subsequent parametric perfusion maps were calculated using RAPID software. CONTRAST:  56mL OMNIPAQUE IOHEXOL 350 MG/ML SOLN, 07/07/2020 OMNIPAQUE IOHEXOL 350 MG/ML SOLN; 45m OMNIPAQUE IOHEXOL 350 MG/ML SOLN COMPARISON:  Noncontrast head CT performed earlier today 07/05/2020. FINDINGS: CTA NECK FINDINGS Aortic arch: The visualized aortic arch is normal in caliber. Standard aortic branching. Streak artifact from a dense left-sided contrast bolus partially obscures the left subclavian artery. Within this limitation, no appreciable hemodynamically significant innominate or proximal subclavian artery stenosis. Right carotid system: CCA and ICA patent within the neck without stenosis or significant atherosclerotic disease. Left carotid system: CCA and ICA patent within the neck without stenosis or significant atherosclerotic disease. Vertebral arteries: Vertebral arteries codominant and patent within the neck without stenosis or atherosclerotic disease. Skeleton: No acute bony abnormality or aggressive osseous lesion. Reversal of  the expected cervical lordosis. Other neck: No neck mass or cervical lymphadenopathy. Upper chest: No consolidation within the imaged lung apices. Review of the MIP images confirms the above findings CTA HEAD FINDINGS Anterior circulation: The intracranial internal carotid arteries are patent. The M1 middle cerebral arteries are patent. No M2 proximal branch occlusion or high-grade proximal stenosis is identified. The  anterior cerebral arteries are patent. Posterior circulation: The intracranial vertebral arteries are patent. The basilar artery is patent. The posterior cerebral arteries are patent. Venous sinuses: Within the limitations of contrast timing, no convincing thrombus. Anatomic variants: Posterior communicating arteries are hypoplastic or absent bilaterally. Review of the MIP images confirms the above findings CT Brain Perfusion Findings: Inadequate contrast bolus or contrast timing, limiting reliability CBF (<30%) Volume: 0mL Perfusion (Tmax>6.0s) volume: 0mL Mismatch Volume: 0mL Infarction Location:None identified No intracranial large vessel occlusion is identified. These results were called by telephone at the time of interpretation on 07/05/2020 at 6:39 pm to provider [facility] , who verbally acknowledged these results. IMPRESSION: CTA neck: The common carotid, internal carotid and vertebral arteries are patent within the neck without stenosis. CTA head: No intracranial large vessel occlusion or proximal high-grade arterial stenosis identified. CT perfusion head: Inadequate contrast bolus or contrast timing, limiting reliability of the perfusion data. Within this limitation, the perfusion software identifies no core infarct or critically hypoperfused parenchyma. Electronically Signed   By: Jackey Loge DO   On: 07/05/2020 18:40   CT HEAD CODE STROKE WO CONTRAST  Result Date: 07/05/2020 CLINICAL DATA:  Code stroke. Neuro deficit, acute, stroke suspected. Right-sided drift. EXAM: CT HEAD WITHOUT CONTRAST TECHNIQUE: Contiguous axial images were obtained from the base of the skull through the vertex without intravenous contrast. COMPARISON:  No pertinent prior exams available for comparison. FINDINGS: Brain: Cerebral volume is normal. There is no acute intracranial hemorrhage. No demarcated cortical infarct. No extra-axial fluid collection. No evidence of an intracranial mass. No midline shift. Vascular: No  hyperdense vessel. Skull: Normal. Negative for fracture or focal lesion. Sinuses/Orbits: Visualized orbits show no acute finding. No significant paranasal sinus disease at the imaged levels. ASPECTS (Alberta Stroke Program Early CT Score) - Ganglionic level infarction (caudate, lentiform nuclei, internal capsule, insula, M1-M3 cortex): 7 - Supraganglionic infarction (M4-M6 cortex): 3 Total score (0-10 with 10 being normal): 10 These results were called by telephone at the time of interpretation on 07/05/2020 at 5:51 pm to provider Dr. Derry Lory, who verbally acknowledged these results. IMPRESSION: No evidence of acute intracranial abnormality.  ASPECTS is 10. Electronically Signed   By: Jackey Loge DO   On: 07/05/2020 17:54   CT ANGIO HEAD CODE STROKE  Result Date: 07/05/2020 CLINICAL DATA:  Stroke/TIA, assess intracranial arteries. Stroke/TIA, assess extracranial arteries. EXAM: CT ANGIOGRAPHY HEAD AND NECK CT PERFUSION BRAIN TECHNIQUE: Multidetector CT imaging of the head and neck was performed using the standard protocol during bolus administration of intravenous contrast. Multiplanar CT image reconstructions and MIPs were obtained to evaluate the vascular anatomy. Carotid stenosis measurements (when applicable) are obtained utilizing NASCET criteria, using the distal internal carotid diameter as the denominator. Multiphase CT imaging of the brain was performed following IV bolus contrast injection. Subsequent parametric perfusion maps were calculated using RAPID software. CONTRAST:  50mL OMNIPAQUE IOHEXOL 350 MG/ML SOLN, OMNIPAQUE IOHEXOL 350 MG/ML SOLN; OMNIPAQUE IOHEXOL 350 MG/ML SOLN COMPARISON:  Noncontrast head CT performed earlier today 07/05/2020. FINDINGS: CTA NECK FINDINGS Aortic arch: The visualized aortic arch is normal in caliber. Standard aortic branching. Streak  artifact from a dense left-sided contrast bolus partially obscures the left subclavian artery. Within this limitation, no  appreciable hemodynamically significant innominate or proximal subclavian artery stenosis. Right carotid system: CCA and ICA patent within the neck without stenosis or significant atherosclerotic disease. Left carotid system: CCA and ICA patent within the neck without stenosis or significant atherosclerotic disease. Vertebral arteries: Vertebral arteries codominant and patent within the neck without stenosis or atherosclerotic disease. Skeleton: No acute bony abnormality or aggressive osseous lesion. Reversal of the expected cervical lordosis. Other neck: No neck mass or cervical lymphadenopathy. Upper chest: No consolidation within the imaged lung apices. Review of the MIP images confirms the above findings CTA HEAD FINDINGS Anterior circulation: The intracranial internal carotid arteries are patent. The M1 middle cerebral arteries are patent. No M2 proximal branch occlusion or high-grade proximal stenosis is identified. The anterior cerebral arteries are patent. Posterior circulation: The intracranial vertebral arteries are patent. The basilar artery is patent. The posterior cerebral arteries are patent. Venous sinuses: Within the limitations of contrast timing, no convincing thrombus. Anatomic variants: Posterior communicating arteries are hypoplastic or absent bilaterally. Review of the MIP images confirms the above findings CT Brain Perfusion Findings: Inadequate contrast bolus or contrast timing, limiting reliability CBF (<30%) Volume: 88mL Perfusion (Tmax>6.0s) volume: 93mL Mismatch Volume: 38mL Infarction Location:None identified No intracranial large vessel occlusion is identified. These results were called by telephone at the time of interpretation on 07/05/2020 at 6:39 pm to [facility] , who verbally acknowledged these results. IMPRESSION: CTA neck: The common carotid, internal carotid and vertebral arteries are patent within the neck without stenosis. CTA head: No intracranial large vessel  occlusion or proximal high-grade arterial stenosis identified. CT perfusion head: Inadequate contrast bolus or contrast timing, limiting reliability of the perfusion data. Within this limitation, the perfusion software identifies no core infarct or critically hypoperfused parenchyma. Electronically Signed   By: Jackey Loge DO   On: 07/05/2020 18:40   CT ANGIO NECK CODE STROKE  Result Date: 07/05/2020 CLINICAL DATA:  Stroke/TIA, assess intracranial arteries. Stroke/TIA, assess extracranial arteries. EXAM: CT ANGIOGRAPHY HEAD AND NECK CT PERFUSION BRAIN TECHNIQUE: Multidetector CT imaging of the head and neck was performed using the standard protocol during bolus administration of intravenous contrast. Multiplanar CT image reconstructions and MIPs were obtained to evaluate the vascular anatomy. Carotid stenosis measurements (when applicable) are obtained utilizing NASCET criteria, using the distal internal carotid diameter as the denominator. Multiphase CT imaging of the brain was performed following IV bolus contrast injection. Subsequent parametric perfusion maps were calculated using RAPID software. CONTRAST:  9mL OMNIPAQUE IOHEXOL 350 MG/ML SOLN, OMNIPAQUE IOHEXOL 350 MG/ML SOLN; OMNIPAQUE IOHEXOL 350 MG/ML SOLN COMPARISON:  Noncontrast head CT performed earlier today 07/05/2020. FINDINGS: CTA NECK FINDINGS Aortic arch: The visualized aortic arch is normal in caliber. Standard aortic branching. Streak artifact from a dense left-sided contrast bolus partially obscures the left subclavian artery. Within this limitation, no appreciable hemodynamically significant innominate or proximal subclavian artery stenosis. Right carotid system: CCA and ICA patent within the neck without stenosis or significant atherosclerotic disease. Left carotid system: CCA and ICA patent within the neck without stenosis or significant atherosclerotic disease. Vertebral arteries: Vertebral arteries codominant and patent  within the neck without stenosis or atherosclerotic disease. Skeleton: No acute bony abnormality or aggressive osseous lesion. Reversal of the expected cervical lordosis. Other neck: No neck mass or cervical lymphadenopathy. Upper chest: No consolidation within the imaged lung apices. Review of the MIP images confirms the  above findings CTA HEAD FINDINGS Anterior circulation: The intracranial internal carotid arteries are patent. The M1 middle cerebral arteries are patent. No M2 proximal branch occlusion or high-grade proximal stenosis is identified. The anterior cerebral arteries are patent. Posterior circulation: The intracranial vertebral arteries are patent. The basilar artery is patent. The posterior cerebral arteries are patent. Venous sinuses: Within the limitations of contrast timing, no convincing thrombus. Anatomic variants: Posterior communicating arteries are hypoplastic or absent bilaterally. Review of the MIP images confirms the above findings CT Brain Perfusion Findings: Inadequate contrast bolus or contrast timing, limiting reliability CBF (<30%) Volume: 3mL Perfusion (Tmax>6.0s) volume: 53mL Mismatch Volume: 22mL Infarction Location:None identified No intracranial large vessel occlusion is identified. These results were called by telephone at the time of interpretation on 07/05/2020 at 6:39 pm to provider [facility] , who verbally acknowledged these results. IMPRESSION: CTA neck: The common carotid, internal carotid and vertebral arteries are patent within the neck without stenosis. CTA head: No intracranial large vessel occlusion or proximal high-grade arterial stenosis identified. CT perfusion head: Inadequate contrast bolus or contrast timing, limiting reliability of the perfusion data. Within this limitation, the perfusion software identifies no core infarct or critically hypoperfused parenchyma. Electronically Signed   By: Jackey Loge DO   On: 07/05/2020 18:40    PHYSICAL EXAM General:  Pleasant young lady; no acute distress. Psych: Affect appropriate to situation Eyes: No scleral injection HENT: No OP obstrucion Head: Normocephalic.  Cardiovascular: Normal rate and regular rhythm.  Respiratory: Effort normal and breath sounds normal to anterior ascultation GI: Soft.  No distension. There is no tenderness.  Skin: WDI    Neurological Examination Mental Status: Alert, oriented, thought content appropriate.  Speech fluent without evidence of aphasia. Able to follow 3 step commands without difficulty. Cranial Nerves: II: Visual fields grossly normal,  III,IV, VI: ptosis not present, extra-ocular motions intact bilaterally, pupils equal, round, reactive to light and accommodation V,VII: smile symmetric, facial light touch sensation normal bilaterally VIII: hearing normal bilaterally IX,X: uvula rises symmetrically XI: bilateral shoulder shrug XII: midline tongue extension Motor: Right : Upper extremity   5/5    Left:     Upper extremity   5/5  Lower extremity   5/5     Lower extremity   5/5 Tone and bulk:normal tone throughout; no atrophy noted Sensory: Pinprick and light touch intact throughout, bilaterally Deep Tendon Reflexes: 2+ and symmetric throughout Plantars: Right: downgoing   Left: downgoing Cerebellar: normal finger-to-nose, normal rapid alternating movements and normal heel-to-shin test Gait: normal gait and station   ASSESSMENT/PLAN Katherine Sparks is a 20 y.o. female with no PMH, presents to ER by POV with right side numbness and weakness. She was treated with tPA. Symptoms resolved after ~2h. MRI neg for acute ischemic changes. Stroke risks are that she smokes and uses estrogen patch.  Strokelike episode possibly complex migraine (reports freq h/a's but none at time of symptom onset) Code Stroke CT head No acute abnormality.  ASPECTS 10.    CTA head & neck no stenosis or LVO CT perfusion limited study; non-diagnositc MRI  neg 2D Echo  +PFO LDL 88 HgbA1c 4.7 VTE prophylaxis - freely ambulating; not indicated    Diet   Diet regular Room service appropriate? Yes; Fluid consistency: Thin   none  prior to admission, now on none Therapy recommendations:  pending Disposition:  likely dc home tomorrow  Hypertension Home meds:  none- no dx Stable Permissive hypertension (OK if < 220/120) but gradually  normalize in 5-7 days Long-term BP goal normotensive  Hyperlipidemia Home meds:  none; no dx LDL 88, goal < 100 High intensity statin is not indicated as she does not have a stroke Continue statin at discharge  Diabetes type II no dx HgbA1c 4.7, goal < 7.0 CBGs Recent Labs    07/05/20 1750  GLUCAP 74    SSI  Other Stroke Risk Factors Cigarette smoker; advised to stop smoking Uses estrogen patch; [facility]  [facility] # 1  Desiree Metzger-Cihelka, ARNP-C, ANVP-BC Pager: [phone]  I have personally obtained history,examined this patient, reviewed notes, independently viewed imaging studies, participated in medical decision making and plan of care.ROS completed by me personally and pertinent positives fully documented  I have made any additions or clarifications directly to the above note. Agree with note above.  Patient presented with fleeting right body paresthesias possibly strokelike episode may be complicated migraine though she had no accompanying headache.  Continue close neurological monitoring and strict blood pressure control as per post tPA protocol.  Mobilize out of bed.  Therapy consults.  Patient counseled to consider stopping estrogen or discussing with her gynecologist alternative hormonal therapy with little to no estrogen content.  Continue ongoing stroke work-up.  No family available at the bedside for discussion.  Check MRI scan of the brain later today.  Check TCD bubble study for PFO transfer to neurology floor bed. This patient is critically ill and at  significant risk of neurological worsening, death and care requires constant monitoring of vital signs, hemodynamics,respiratory and cardiac monitoring, extensive review of multiple databases, frequent neurological assessment, discussion with family, other specialists and medical decision making of high complexity.I have made any additions or clarifications directly to the above note.This critical care time does not reflect procedure time, or teaching time or supervisory time of PA/NP/Med Resident etc but could involve care discussion time.  I spent 30 minutes of neurocritical care time  in the care of  this patient.      Delia HeadyPramod Kamarie Veno, MD Medical Director [facility] Pager: [phone] 07/06/2020 3:58 PM  To contact Stroke Continuity provider, please refer to [email or web address]. After hours, contact General Neurology

## 2020-07-06 NOTE — Progress Notes (Signed)
Benson Norway, RN reported to me that patient admitted last night to using [facility] edibles and that that may be the cause of her symptoms. I paged Dr. Derry Lory at this time to let him know since he was the neurologist that admitted her last night. I will also let Dr. Pearlean Brownie with stroke know when I see him.

## 2020-07-06 NOTE — Progress Notes (Signed)
Per Dr. Pearlean Brownie, we will do a urine drug screen on patient. I gave patient the urine cup and told her to let me know the next time she needs to use the bathroom.

## 2020-07-06 NOTE — Progress Notes (Signed)
OT Cancellation Note  Patient Details Name: Katherine Sparks MRN: [id number] DOB: 2000-11-03   Cancelled Treatment:    Reason Eval/Treat Not Completed: OT screened, no needs identified, will sign off.  Pt appears to be back to baseline and MRI negative.  Eber Jones., OTR/L Acute Rehabilitation Services Pager [phone] Office [phone]   Jeani Hawking M 07/06/2020, 5:30 PM

## 2020-07-07 ENCOUNTER — Encounter (HOSPITAL_COMMUNITY): Payer: Medicaid Other

## 2020-07-07 ENCOUNTER — Other Ambulatory Visit: Payer: Self-pay | Admitting: Neurology

## 2020-07-07 ENCOUNTER — Other Ambulatory Visit (HOSPITAL_COMMUNITY): Payer: Medicaid Other

## 2020-07-07 DIAGNOSIS — Q211 Atrial septal defect: Secondary | ICD-10-CM

## 2020-07-07 DIAGNOSIS — G43009 Migraine without aura, not intractable, without status migrainosus: Secondary | ICD-10-CM

## 2020-07-07 DIAGNOSIS — G43109 Migraine with aura, not intractable, without status migrainosus: Secondary | ICD-10-CM

## 2020-07-07 DIAGNOSIS — R531 Weakness: Secondary | ICD-10-CM

## 2020-07-07 LAB — ANTI-DNA ANTIBODY, DOUBLE-STRANDED: ds DNA Ab: <1 IU/mL (ref 0–9)

## 2020-07-07 LAB — RESP PANEL BY RT-PCR (FLU A&B, COVID) ARPGX2
Influenza A by PCR: NEGATIVE
Influenza B by PCR: NEGATIVE
SARS Coronavirus 2 by RT PCR: NEGATIVE

## 2020-07-07 LAB — ANA W/REFLEX IF POSITIVE: Anti Nuclear Antibody (ANA): NEGATIVE

## 2020-07-07 NOTE — Progress Notes (Signed)
AVS discussed with patient and all questions answered. Both IV's removed at this time. Patient independently getting dressed and will be taken downstairs by NT in wheelchair.   BP 103/62 HR 80 RR 17 SPO2 97% Temp 98.0

## 2020-07-07 NOTE — Discharge Instructions (Signed)
If you are in need of a shelter please call [facility] ([facility] at [phone] between the hours of 9am-5pm Mon-Fri.  [facility] will contact all the local shelters to find openings.  Right now they are requiring people to quarantine at a hotel before they can go to an open bed but [facility] may be able to set that up as well.  They are not open on weekends.  On Monday-Friday morning at 8 am until 1 pm, you can also go to the [facility] (see below) to seek shelter in the Hotel Quarantine Program prior to entering a shelter. You can also call the number provided (see the above paragraph) to seek placement into the program by calling [facility].   [facility] [address] [place], [place] [address] Phone: [phone] Fax: [phone]  For Free Breakfasts and Lunches 7 days a week you can go to: [facility] [address], [place], [place] [address] Hours: Open today  8AM-5PM Phone: [phone] Breakfast: 6:30-7:30 am Lunch served: 10:40 am - 12:40pm         [facility] M-F 8am-3pm   [address] [place], [place] [address]   [phone] Services include: laundry, barbering, support groups, case management, phone  & computer access, showers, AA/NA mtgs, mental health/substance abuse nurse, job skills class, disability information, VA assistance, spiritual classes, etc.   [facility] [address] [place], [place]   [phone] Provides breakfast each weekday morning except Wednesdays, and an evening community meal every Friday. Access to showers is available during breakfast hours and telephones for seeking work are also provided. Also offers job referral and counseling for the homeless and unemployed.  [facility]OMEL[facility] SHELTER[facility]   [facility] [address]     [facility] [place], [place] [address]     [address], [place]  [phone]      [phone]  [facility]   [facility] [address]    [address] [place] [address]     [place], [place] [address] [phone]       [phone]  [facility] (women only) [address] [place], [place] [address] [phone]  [facility] [place] (overflow shelter: [address] [place], [place] check in at 6pm for placement in local shelter).  [address], [place], [place] [address] Phone: [phone]  Crisis Services [facility]     [place] [facility]   Crisis Services      [place] [facility] [phone]      [phone] [address]     [address] [place], [place] [address]     [place],  [address]    [facility] Crisis Management - [phone]  

## 2020-07-07 NOTE — Discharge Summary (Addendum)
Stroke Discharge Summary  Patient ID: Katherine Sparks   MRN: [id number]      DOB: [birth year]  Date of Admission: 07/05/2020 Date of Discharge: 07/07/2020  Attending Physician:  Stroke, Md, MD, Stroke MD Consultant(s):    none Patient's PCP:  Default, Provider, MD  DISCHARGE DIAGNOSIS: Complex migraine with stroke-like symptoms    Allergies as of 07/07/2020   No Known Allergies      Medication List     TAKE these medications    estradiol 0.1 mg/24hr patch Commonly known as: CLIMARA - Dosed in mg/24 hr Place 0.1 mg onto the skin every Sunday.   progesterone 200 MG capsule Commonly known as: PROMETRIUM Take 200 mg by mouth See admin instructions. Take one tablet (200 mg) by mouth at bedtime 12 nights out of every month        LABORATORY STUDIES CBC    Component Value Date/Time   WBC 5.2 07/06/2020 0435   RBC 4.61 07/06/2020 0435   HGB 13.7 07/06/2020 0435   HCT 39.7 07/06/2020 0435   PLT 304 07/06/2020 0435   MCV 86.1 07/06/2020 0435   MCH 29.7 07/06/2020 0435   MCHC 34.5 07/06/2020 0435   RDW 12.1 07/06/2020 0435   LYMPHSABS 1.9 07/05/2020 1736   MONOABS 0.6 07/05/2020 1736   EOSABS 0.1 07/05/2020 1736   BASOSABS 0.0 07/05/2020 1736   CMP    Component Value Date/Time   NA 138 07/06/2020 0435   K 3.6 07/06/2020 0435   CL 105 07/06/2020 0435   CO2 25 07/06/2020 0435   GLUCOSE 88 07/06/2020 0435   BUN 8 07/06/2020 0435   CREATININE 0.79 07/06/2020 0435   CALCIUM 9.1 07/06/2020 0435   PROT 6.6 07/06/2020 0435   ALBUMIN 3.3 (L) 07/06/2020 0435   AST 20 07/06/2020 0435   ALT 8 07/06/2020 0435   ALKPHOS 65 07/06/2020 0435   BILITOT 1.5 (H) 07/06/2020 0435   GFRNONAA >60 07/06/2020 0435   COAGS Lab Results  Component Value Date   INR 1.0 07/05/2020   Lipid Panel    Component Value Date/Time   CHOL 152 07/06/2020 0435   TRIG 48 07/06/2020 0435   HDL 54 07/06/2020 0435   CHOLHDL 2.8 07/06/2020 0435   VLDL 10 07/06/2020 0435   LDLCALC 88  07/06/2020 0435   HgbA1C  Lab Results  Component Value Date   HGBA1C 4.7 (L) 07/06/2020   Urinalysis    Component Value Date/Time   COLORURINE YELLOW 07/06/2020 0851   APPEARANCEUR CLEAR 07/06/2020 0851   LABSPEC 1.025 07/06/2020 0851   PHURINE 6.0 07/06/2020 0851   GLUCOSEU NEGATIVE 07/06/2020 0851   HGBUR TRACE (A) 07/06/2020 0851   BILIRUBINUR NEGATIVE 07/06/2020 0851   KETONESUR NEGATIVE 07/06/2020 0851   PROTEINUR NEGATIVE 07/06/2020 0851   NITRITE NEGATIVE 07/06/2020 0851   LEUKOCYTESUR NEGATIVE 07/06/2020 0851   Urine Drug Screen     Component Value Date/Time   LABOPIA NONE DETECTED 07/06/2020 0851   COCAINSCRNUR NONE DETECTED 07/06/2020 0851   LABBENZ NONE DETECTED 07/06/2020 0851   AMPHETMU NONE DETECTED 07/06/2020 0851   THCU POSITIVE (A) 07/06/2020 0851   LABBARB NONE DETECTED 07/06/2020 0851    Alcohol Level    Component Value Date/Time   ETH <10 07/05/2020 1736     SIGNIFICANT DIAGNOSTIC STUDIES MR BRAIN WO CONTRAST  Result Date: 07/06/2020 CLINICAL DATA:  Stroke follow-up. Right-sided weakness and numbness. EXAM: MRI HEAD WITHOUT CONTRAST TECHNIQUE: Multiplanar, multiecho pulse sequences of the brain  and surrounding structures were obtained without intravenous contrast. COMPARISON:  Head CT, CTA, and CTP 07/05/2020 FINDINGS: Brain: There is no evidence of an acute infarct, intracranial hemorrhage, mass, midline shift, or extra-axial fluid collection. The ventricles and sulci are normal. The brain is normal in signal. Vascular: Major intracranial vascular flow voids are preserved. Skull and upper cervical spine: Unremarkable bone marrow signal. Sinuses/Orbits: Unremarkable orbits. Mild mucosal thickening in the paranasal sinuses. Trace right mastoid fluid. Other: None. IMPRESSION: Unremarkable appearance of the brain. Electronically Signed   By: Sebastian Ache M.D.   On: 07/06/2020 14:05   MR Venogram Head  Result Date: 07/06/2020 CLINICAL DATA:  Stroke  follow-up. Right-sided weakness and numbness. EXAM: MR VENOGRAM HEAD WITHOUT AND WITH CONTRAST TECHNIQUE: Angiographic images of the intracranial venous structures were acquired using MRV technique without and with intravenous contrast. CONTRAST:  69mL GADAVIST GADOBUTROL 1 MMOL/ML IV SOLN COMPARISON:  Head CTA 07/05/2020 and head MRI 07/06/2020 FINDINGS: The superior sagittal sinus, internal cerebral veins, vein of Galen, straight sinus, transverse sinuses, sigmoid sinuses, and jugular bulbs are patent without convincing thrombus. The right transverse and sigmoid sinuses are mildly dominant. There is moderate multifocal narrowing of the midportion of the superior sagittal sinus with the appearance of both internal filling defects as well as compression by extrinsic structures on source images. Comparison with today's brain MRI demonstrates multiple lobular T2 hyperintense structures on both sides of the sinus compressing and extending into the sinus, and the overall appearance is most consistent with multiple prominent arachnoid granulations. IMPRESSION: 1. No evidence of dural venous sinus thrombosis. 2. 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 Electronically Signed   By: Sebastian Ache M.D.   On: 07/06/2020 14:12   CT CEREBRAL PERFUSION W CONTRAST  Result Date: 07/05/2020 CLINICAL DATA:  Stroke/TIA, assess intracranial arteries. Stroke/TIA, assess extracranial arteries. EXAM: CT ANGIOGRAPHY HEAD AND NECK CT PERFUSION BRAIN TECHNIQUE: Multidetector CT imaging of the head and neck was performed using the standard protocol during bolus administration of intravenous contrast. Multiplanar CT image reconstructions and MIPs were obtained to evaluate the vascular anatomy. Carotid stenosis measurements (when applicable) are obtained utilizing NASCET criteria, using the distal internal carotid diameter as the denominator. Multiphase CT imaging of the brain  was performed following IV bolus contrast injection. Subsequent parametric perfusion maps were calculated using RAPID software. CONTRAST:  35mL OMNIPAQUE IOHEXOL 350 MG/ML SOLN, OMNIPAQUE IOHEXOL 350 MG/ML SOLN; OMNIPAQUE IOHEXOL 350 MG/ML SOLN COMPARISON:  Noncontrast head CT performed earlier today 07/05/2020. FINDINGS: CTA NECK FINDINGS Aortic arch: The visualized aortic arch is normal in caliber. Standard aortic branching. Streak artifact from a dense left-sided contrast bolus partially obscures the left subclavian artery. Within this limitation, no appreciable hemodynamically significant innominate or proximal subclavian artery stenosis. Right carotid system: CCA and ICA patent within the neck without stenosis or significant atherosclerotic disease. Left carotid system: CCA and ICA patent within the neck without stenosis or significant atherosclerotic disease. Vertebral arteries: Vertebral arteries codominant and patent within the neck without stenosis or atherosclerotic disease. Skeleton: No acute bony abnormality or aggressive osseous lesion. Reversal of the expected cervical lordosis. Other neck: No neck mass or cervical lymphadenopathy. Upper chest: No consolidation within the imaged lung apices. Review of the MIP images confirms the above findings CTA HEAD FINDINGS Anterior circulation: The intracranial internal carotid arteries are patent. The M1 middle cerebral arteries are patent. No M2 proximal branch occlusion or high-grade proximal stenosis is identified. The anterior  cerebral arteries are patent. Posterior circulation: The intracranial vertebral arteries are patent. The basilar artery is patent. The posterior cerebral arteries are patent. Venous sinuses: Within the limitations of contrast timing, no convincing thrombus. Anatomic variants: Posterior communicating arteries are hypoplastic or absent bilaterally. Review of the MIP images confirms the above findings CT Brain Perfusion  Findings: Inadequate contrast bolus or contrast timing, limiting reliability CBF (<30%) Volume: 0mL Perfusion (Tmax>6.0s) volume: 0mL Mismatch Volume: 0mL Infarction Location:None identified No intracranial large vessel occlusion is identified. These results were called by telephone at the time of interpretation on 07/05/2020 at 6:39 pm to provider [facility] , who verbally acknowledged these results. IMPRESSION: CTA neck: The common carotid, internal carotid and vertebral arteries are patent within the neck without stenosis. CTA head: No intracranial large vessel occlusion or proximal high-grade arterial stenosis identified. CT perfusion head: Inadequate contrast bolus or contrast timing, limiting reliability of the perfusion data. Within this limitation, the perfusion software identifies no core infarct or critically hypoperfused parenchyma. Electronically Signed   By: Jackey Loge DO   On: 07/05/2020 18:40   ECHOCARDIOGRAM COMPLETE BUBBLE STUDY  Result Date: 07/06/2020    ECHOCARDIOGRAM REPORT   Patient Name:   Katherine Sparks Date of Exam: 07/06/2020 Medical Rec #:  [id number]        Height: Accession #:    [id number]       Weight:       134.9 lb Date of Birth:  [birth year]        BSA:          1.553 m Patient Age:    20 years         BP:           88/57 mmHg Patient Gender: F                HR:           67 bpm. Exam Location:  Inpatient Procedure: 2D Echo, 3D Echo, Cardiac Doppler, Color Doppler and Saline Contrast            Bubble Study Indications:    Stroke  History:        Patient has no prior history of Echocardiogram examinations.                 TIA. No prior cardiac history.  Sonographer:    Sheralyn Boatman RDCS Referring Phys: [id number] [facility] IMPRESSIONS  1. Positive saline microcavitation study suggestive of patent foramen ovale.  2. Left ventricular ejection fraction, by estimation, is 55 to 60%. The left ventricle has normal function. The left ventricle has no regional wall motion  abnormalities. Left ventricular diastolic parameters were normal.  3. Right ventricular systolic function is normal. The right ventricular size is normal. Tricuspid regurgitation signal is inadequate for assessing PA pressure.  4. The mitral valve is normal in structure. Trivial mitral valve regurgitation. No evidence of mitral stenosis.  5. The aortic valve is tricuspid. Aortic valve regurgitation is not visualized. No aortic stenosis is present.  6. The inferior vena cava is normal in size with greater than 50% respiratory variability, suggesting right atrial pressure of 3 mmHg.  7. Agitated saline contrast bubble study was positive with shunting observed within 3-6 cardiac cycles suggestive of interatrial shunt. FINDINGS  Left Ventricle: Left ventricular ejection fraction, by estimation, is 55 to 60%. The left ventricle has normal function. The left ventricle has no regional wall motion abnormalities. The left ventricular internal cavity size  was normal in size. There is  no left ventricular hypertrophy. Left ventricular diastolic parameters were normal. Right Ventricle: The right ventricular size is normal. Right ventricular systolic function is normal. Tricuspid regurgitation signal is inadequate for assessing PA pressure. The tricuspid regurgitant velocity is 2.05 m/s, and with an assumed right atrial  pressure of 3 mmHg, the estimated right ventricular systolic pressure is 19.8 mmHg. Left Atrium: Left atrial size was normal in size. Right Atrium: Right atrial size was normal in size. Pericardium: There is no evidence of pericardial effusion. Mitral Valve: The mitral valve is normal in structure. Trivial mitral valve regurgitation. No evidence of mitral valve stenosis. Tricuspid Valve: The tricuspid valve is normal in structure. Tricuspid valve regurgitation is mild . No evidence of tricuspid stenosis. Aortic Valve: The aortic valve is tricuspid. Aortic valve regurgitation is not visualized. No aortic stenosis  is present. Pulmonic Valve: The pulmonic valve was normal in structure. Pulmonic valve regurgitation is not visualized. No evidence of pulmonic stenosis. Aorta: The aortic root is normal in size and structure. Venous: The inferior vena cava is normal in size with greater than 50% respiratory variability, suggesting right atrial pressure of 3 mmHg. IAS/Shunts: No atrial level shunt detected by color flow Doppler. Agitated saline contrast was given intravenously to evaluate for intracardiac shunting. Agitated saline contrast bubble study was positive with shunting observed within 3-6 cardiac cycles suggestive of interatrial shunt. Additional Comments: Positive saline microcavitation study suggestive of patent foramen ovale.  LEFT VENTRICLE PLAX 2D LVIDd:         4.30 cm     Diastology LVIDs:         2.90 cm     LV e' medial:    12.00 cm/s LV PW:         0.70 cm     LV E/e' medial:  6.9 LV IVS:        0.60 cm     LV e' lateral:   23.90 cm/s LVOT diam:     1.80 cm     LV E/e' lateral: 3.5 LV SV:         67 LV SV Index:   43 LVOT Area:     2.54 cm  LV Volumes (MOD) LV vol d, MOD A2C: 89.5 ml LV vol d, MOD A4C: 69.1 ml LV vol s, MOD A2C: 29.5 ml LV vol s, MOD A4C: 30.2 ml LV SV MOD A2C:     60.0 ml LV SV MOD A4C:     69.1 ml LV SV MOD BP:      53.1 ml RIGHT VENTRICLE             IVC RV S prime:     12.30 cm/s  IVC diam: 2.00 cm TAPSE (M-mode): 1.8 cm LEFT ATRIUM             Index       RIGHT ATRIUM          Index LA diam:        2.60 cm 1.67 cm/m  RA Area:     6.46 cm LA Vol (A2C):   19.8 ml 12.75 ml/m RA Volume:   8.78 ml  5.65 ml/m LA Vol (A4C):   14.1 ml 9.08 ml/m LA Biplane Vol: 16.8 ml 10.82 ml/m  AORTIC VALVE LVOT Vmax:   124.00 cm/s LVOT Vmean:  84.900 cm/s LVOT VTI:    0.265 m  AORTA Ao Root diam: 2.80 cm Ao Asc diam:  2.70 cm  MITRAL VALVE               TRICUSPID VALVE MV Area (PHT): 3.88 cm    TR Peak grad:   16.8 mmHg MV Decel Time: 196 msec    TR Vmax:        205.00 cm/s MV E velocity: 83.35 cm/s MV A  velocity: 48.45 cm/s  SHUNTS MV E/A ratio:  1.72        Systemic VTI:  0.26 m                            Systemic Diam: 1.80 cm Olga Millers MD Electronically signed by Olga Millers MD Signature Date/Time: 07/06/2020/2:57:25 PM    Final    CT HEAD CODE STROKE WO CONTRAST  Result Date: 07/05/2020 CLINICAL DATA:  Code stroke. Neuro deficit, acute, stroke suspected. Right-sided drift. EXAM: CT HEAD WITHOUT CONTRAST TECHNIQUE: Contiguous axial images were obtained from the base of the skull through the vertex without intravenous contrast. COMPARISON:  No pertinent prior exams available for comparison. FINDINGS: Brain: Cerebral volume is normal. There is no acute intracranial hemorrhage. No demarcated cortical infarct. No extra-axial fluid collection. No evidence of an intracranial mass. No midline shift. Vascular: No hyperdense vessel. Skull: Normal. Negative for fracture or focal lesion. Sinuses/Orbits: Visualized orbits show no acute finding. No significant paranasal sinus disease at the imaged levels. ASPECTS ([place] Stroke Program Early CT Score) - Ganglionic level infarction (caudate, lentiform nuclei, internal capsule, insula, M1-M3 cortex): 7 - Supraganglionic infarction (M4-M6 cortex): 3 Total score (0-10 with 10 being normal): 10 These results were called by telephone at the time of interpretation on 07/05/2020 at 5:51 pm to provider Dr. Derry Lory, who verbally acknowledged these results. IMPRESSION: No evidence of acute intracranial abnormality.  ASPECTS is 10. Electronically Signed   By: Jackey Loge DO   On: 07/05/2020 17:54   CT ANGIO HEAD CODE STROKE  Result Date: 07/05/2020 CLINICAL DATA:  Stroke/TIA, assess intracranial arteries. Stroke/TIA, assess extracranial arteries. EXAM: CT ANGIOGRAPHY HEAD AND NECK CT PERFUSION BRAIN TECHNIQUE: Multidetector CT imaging of the head and neck was performed using the standard protocol during bolus administration of intravenous contrast. Multiplanar CT  image reconstructions and MIPs were obtained to evaluate the vascular anatomy. Carotid stenosis measurements (when applicable) are obtained utilizing NASCET criteria, using the distal internal carotid diameter as the denominator. Multiphase CT imaging of the brain was performed following IV bolus contrast injection. Subsequent parametric perfusion maps were calculated using RAPID software. CONTRAST:  64mL OMNIPAQUE IOHEXOL 350 MG/ML SOLN, OMNIPAQUE IOHEXOL 350 MG/ML SOLN; OMNIPAQUE IOHEXOL 350 MG/ML SOLN COMPARISON:  Noncontrast head CT performed earlier today 07/05/2020. FINDINGS: CTA NECK FINDINGS Aortic arch: The visualized aortic arch is normal in caliber. Standard aortic branching. Streak artifact from a dense left-sided contrast bolus partially obscures the left subclavian artery. Within this limitation, no appreciable hemodynamically significant innominate or proximal subclavian artery stenosis. Right carotid system: CCA and ICA patent within the neck without stenosis or significant atherosclerotic disease. Left carotid system: CCA and ICA patent within the neck without stenosis or significant atherosclerotic disease. Vertebral arteries: Vertebral arteries codominant and patent within the neck without stenosis or atherosclerotic disease. Skeleton: No acute bony abnormality or aggressive osseous lesion. Reversal of the expected cervical lordosis. Other neck: No neck mass or cervical lymphadenopathy. Upper chest: No consolidation within the imaged lung apices. Review of the MIP images confirms the above findings CTA HEAD FINDINGS Anterior  circulation: The intracranial internal carotid arteries are patent. The M1 middle cerebral arteries are patent. No M2 proximal branch occlusion or high-grade proximal stenosis is identified. The anterior cerebral arteries are patent. Posterior circulation: The intracranial vertebral arteries are patent. The basilar artery is patent. The posterior cerebral arteries  are patent. Venous sinuses: Within the limitations of contrast timing, no convincing thrombus. Anatomic variants: Posterior communicating arteries are hypoplastic or absent bilaterally. Review of the MIP images confirms the above findings CT Brain Perfusion Findings: Inadequate contrast bolus or contrast timing, limiting reliability CBF (<30%) Volume: 0mL Perfusion (Tmax>6.0s) volume: 0mL Mismatch Volume: 0mL Infarction Location:None identified No intracranial large vessel occlusion is identified. These results were called by telephone at the time of interpretation on 07/05/2020 at 6:39 pm to provider [facility] , who verbally acknowledged these results. IMPRESSION: CTA neck: The common carotid, internal carotid and vertebral arteries are patent within the neck without stenosis. CTA head: No intracranial large vessel occlusion or proximal high-grade arterial stenosis identified. CT perfusion head: Inadequate contrast bolus or contrast timing, limiting reliability of the perfusion data. Within this limitation, the perfusion software identifies no core infarct or critically hypoperfused parenchyma. Electronically Signed   By: Jackey Loge DO   On: 07/05/2020 18:40   CT ANGIO NECK CODE STROKE  Result Date: 07/05/2020 CLINICAL DATA:  Stroke/TIA, assess intracranial arteries. Stroke/TIA, assess extracranial arteries. EXAM: CT ANGIOGRAPHY HEAD AND NECK CT PERFUSION BRAIN TECHNIQUE: Multidetector CT imaging of the head and neck was performed using the standard protocol during bolus administration of intravenous contrast. Multiplanar CT image reconstructions and MIPs were obtained to evaluate the vascular anatomy. Carotid stenosis measurements (when applicable) are obtained utilizing NASCET criteria, using the distal internal carotid diameter as the denominator. Multiphase CT imaging of the brain was performed following IV bolus contrast injection. Subsequent parametric perfusion maps were calculated using RAPID  software. CONTRAST:  50mL OMNIPAQUE IOHEXOL 350 MG/ML SOLN, OMNIPAQUE IOHEXOL 350 MG/ML SOLN; OMNIPAQUE IOHEXOL 350 MG/ML SOLN COMPARISON:  Noncontrast head CT performed earlier today 07/05/2020. FINDINGS: CTA NECK FINDINGS Aortic arch: The visualized aortic arch is normal in caliber. Standard aortic branching. Streak artifact from a dense left-sided contrast bolus partially obscures the left subclavian artery. Within this limitation, no appreciable hemodynamically significant innominate or proximal subclavian artery stenosis. Right carotid system: CCA and ICA patent within the neck without stenosis or significant atherosclerotic disease. Left carotid system: CCA and ICA patent within the neck without stenosis or significant atherosclerotic disease. Vertebral arteries: Vertebral arteries codominant and patent within the neck without stenosis or atherosclerotic disease. Skeleton: No acute bony abnormality or aggressive osseous lesion. Reversal of the expected cervical lordosis. Other neck: No neck mass or cervical lymphadenopathy. Upper chest: No consolidation within the imaged lung apices. Review of the MIP images confirms the above findings CTA HEAD FINDINGS Anterior circulation: The intracranial internal carotid arteries are patent. The M1 middle cerebral arteries are patent. No M2 proximal branch occlusion or high-grade proximal stenosis is identified. The anterior cerebral arteries are patent. Posterior circulation: The intracranial vertebral arteries are patent. The basilar artery is patent. The posterior cerebral arteries are patent. Venous sinuses: Within the limitations of contrast timing, no convincing thrombus. Anatomic variants: Posterior communicating arteries are hypoplastic or absent bilaterally. Review of the MIP images confirms the above findings CT Brain Perfusion Findings: Inadequate contrast bolus or contrast timing, limiting reliability CBF (<30%) Volume: 0mL Perfusion (Tmax>6.0s)  volume: 0mL Mismatch Volume: 0mL Infarction Location:None identified No intracranial large vessel occlusion  is identified. These results were called by telephone at the time of interpretation on 07/05/2020 at 6:39 pm to provider [facility] , who verbally acknowledged these results. IMPRESSION: CTA neck: The common carotid, internal carotid and vertebral arteries are patent within the neck without stenosis. CTA head: No intracranial large vessel occlusion or proximal high-grade arterial stenosis identified. CT perfusion head: Inadequate contrast bolus or contrast timing, limiting reliability of the perfusion data. Within this limitation, the perfusion software identifies no core infarct or critically hypoperfused parenchyma. Electronically Signed   By: Jackey Loge DO   On: 07/05/2020 18:40      HISTORY OF PRESENT ILLNESS Katherine Sparks is a 20 y.o. female with no PMH, presents to ER by POV with right side numbness and weakness. She was treated with tPA. Symptoms resolved after ~2h. MRI neg for acute ischemic changes. Stroke risks are that she smokes and uses estrogen patch.   HOSPITAL COURSE Strokelike episode possibly complex migraine (reports freq h/a's but none at time of symptom onset) Code Stroke CT head No acute abnormality.  ASPECTS 10.    CTA head & neck no stenosis or LVO CT perfusion limited study; non-diagnositc MRI  neg 2D Echo +PFO (out pt f/u). Closure not indicated at this time. Given non-stroke etiology, do not feel TCD bubble study or LE venous doppler needed at this time. She will follow up with Dr. Pearlean Brownie as outpt LDL 88 HgbA1c 4.7 VTE prophylaxis - freely ambulating; not indicated none  prior to admission, now on none Therapy recommendations: no rehab needs Disposition:  home today   Lipid management Home meds:  none; no dx LDL 88, goal < 100 High intensity statin is not indicated as she does not have a stroke Continue statin at discharge  Other risk  factors Estrogen use for birth control. Recommend alternative means for contraception or progesterone only birth control method. Pt not smoker      DISCHARGE EXAM Blood pressure 94/65, pulse (!) 49, temperature 98.3 F (36.8 C), temperature source Oral, resp. rate 16, weight 61.2 kg, SpO2 98 %. General: Pleasant young lady; no acute distress. Psych: Affect appropriate to situation Eyes: No scleral injection HENT: No OP obstrucion Head: Normocephalic. Cardiovascular: Normal rate and regular rhythm. Respiratory: Effort normal and breath sounds normal to anterior ascultation GI: Soft.  No distension. There is no tenderness. Skin: WDI     Neurological Examination Mental Status: Alert, oriented, thought content appropriate.  Speech fluent without evidence of aphasia. Able to follow 3 step commands without difficulty. Cranial Nerves: II: Visual fields grossly normal, III,IV, VI: ptosis not present, extra-ocular motions intact bilaterally, pupils equal, round, reactive to light and accommodation V,VII: smile symmetric, facial light touch sensation normal bilaterally VIII: hearing normal bilaterally IX,X: uvula rises symmetrically XI: bilateral shoulder shrug XII: midline tongue extension Motor: Right : Upper extremity   5/5                                      Left:     Upper extremity   5/5             Lower extremity   5/5  Lower extremity   5/5 Tone and bulk:normal tone throughout; no atrophy noted Sensory: Pinprick and light touch intact throughout, bilaterally Deep Tendon Reflexes: 2+ and symmetric throughout Plantars: Right: downgoing                               Left: downgoing Cerebellar: normal finger-to-nose, normal rapid alternating movements and normal heel-to-shin test Gait: normal gait and station   Discharge Diet       Diet   Diet regular Room service appropriate? Yes; Fluid consistency: Thin   liquids  DISCHARGE  PLAN Disposition:  home Need to est a Primary Care Physician for post hospital f/u; case mgr can assist Please f/u with OB/GYN re: Estrogen alternative Follow-up in [facility] in 4 weeks, office to schedule an appointment.   35 minutes were spent preparing discharge.  Desiree Metzger-Cihelka, ARNP-C, ANVP-BC Pager: [phone]    ATTENDING NOTE: I reviewed above note and agree with the assessment and plan. Pt was seen and examined.   Patient was seen sitting at edge of bed, eating breakfast.  Still complaining of fatigue and tiredness.  Otherwise, no focal deficit, no acute event overnight.  MRI negative for stroke, patient symptoms most likely due to complicated migraine given migraine history.  However, patient does have PFO on 2D echo, which is more common in migraine patient done none migraine patient.  At this time, given none stroke diagnosis, does not feel TCD bubble study or LE venous Doppler or TEE needed.  Patient uses estrogen birth control pills, recommend alternative contraception means for progesterone only birth control methods.  Recommend follow-up with Dr. Pearlean Brownie at [facility] for further management.  Ready for DC today.  I had long discussion with patient at bedside and her mom over the phone, updated pt current condition, treatment plan and potential prognosis, and answered all the questions. They expressed understanding and appreciation.   For detailed assessment and plan, please refer to above as I have made changes wherever appropriate.   Marvel Plan, MD PhD Stroke Neurology 07/07/2020 3:52 PM

## 2020-07-09 LAB — ANCA TITERS
Atypical P-ANCA titer: <1:20 {titer}
C-ANCA: <1:20 {titer}
P-ANCA: <1:20 {titer}

## 2020-07-10 LAB — ANTIPHOSPHOLIPID SYNDROME EVAL, BLD
Anticardiolipin IgA: 10 APL U/mL (ref 0–11)
Anticardiolipin IgG: 15 GPL U/mL — ABNORMAL HIGH (ref 0–14)
Anticardiolipin IgM: 21 MPL U/mL — ABNORMAL HIGH (ref 0–12)
DRVVT: 46.7 s (ref 0.0–47.0)
PTT Lupus Anticoagulant: 37.8 s (ref 0.0–51.9)
Phosphatydalserine, IgA: 1 APS Units (ref 0–19)
Phosphatydalserine, IgG: 9 Units (ref 0–30)
Phosphatydalserine, IgM: 14 Units (ref 0–30)

## 2020-07-11 ENCOUNTER — Other Ambulatory Visit: Payer: Self-pay

## 2020-07-11 ENCOUNTER — Ambulatory Visit (INDEPENDENT_AMBULATORY_CARE_PROVIDER_SITE_OTHER): Payer: Medicaid Other

## 2020-07-11 ENCOUNTER — Ambulatory Visit (HOSPITAL_COMMUNITY)
Admission: EM | Admit: 2020-07-11 | Discharge: 2020-07-11 | Disposition: A | Discharge status: Home / Self Care | Payer: Medicaid Other | Attending: Urgent Care | Admitting: Urgent Care

## 2020-07-11 ENCOUNTER — Encounter (HOSPITAL_COMMUNITY): Payer: Self-pay

## 2020-07-11 DIAGNOSIS — R0602 Shortness of breath: Secondary | ICD-10-CM

## 2020-07-11 DIAGNOSIS — R059 Cough, unspecified: Secondary | ICD-10-CM

## 2020-07-11 HISTORY — DX: Transient cerebral ischemic attack, unspecified: G45.9

## 2020-07-11 IMAGING — DX DG CHEST 2V
2 series · 2 of 2 positions shown · non-contrast
Comparison: None.

CLINICAL DATA: Cough and short of breath

EXAM:
CHEST - 2 VIEW

[chest ap]
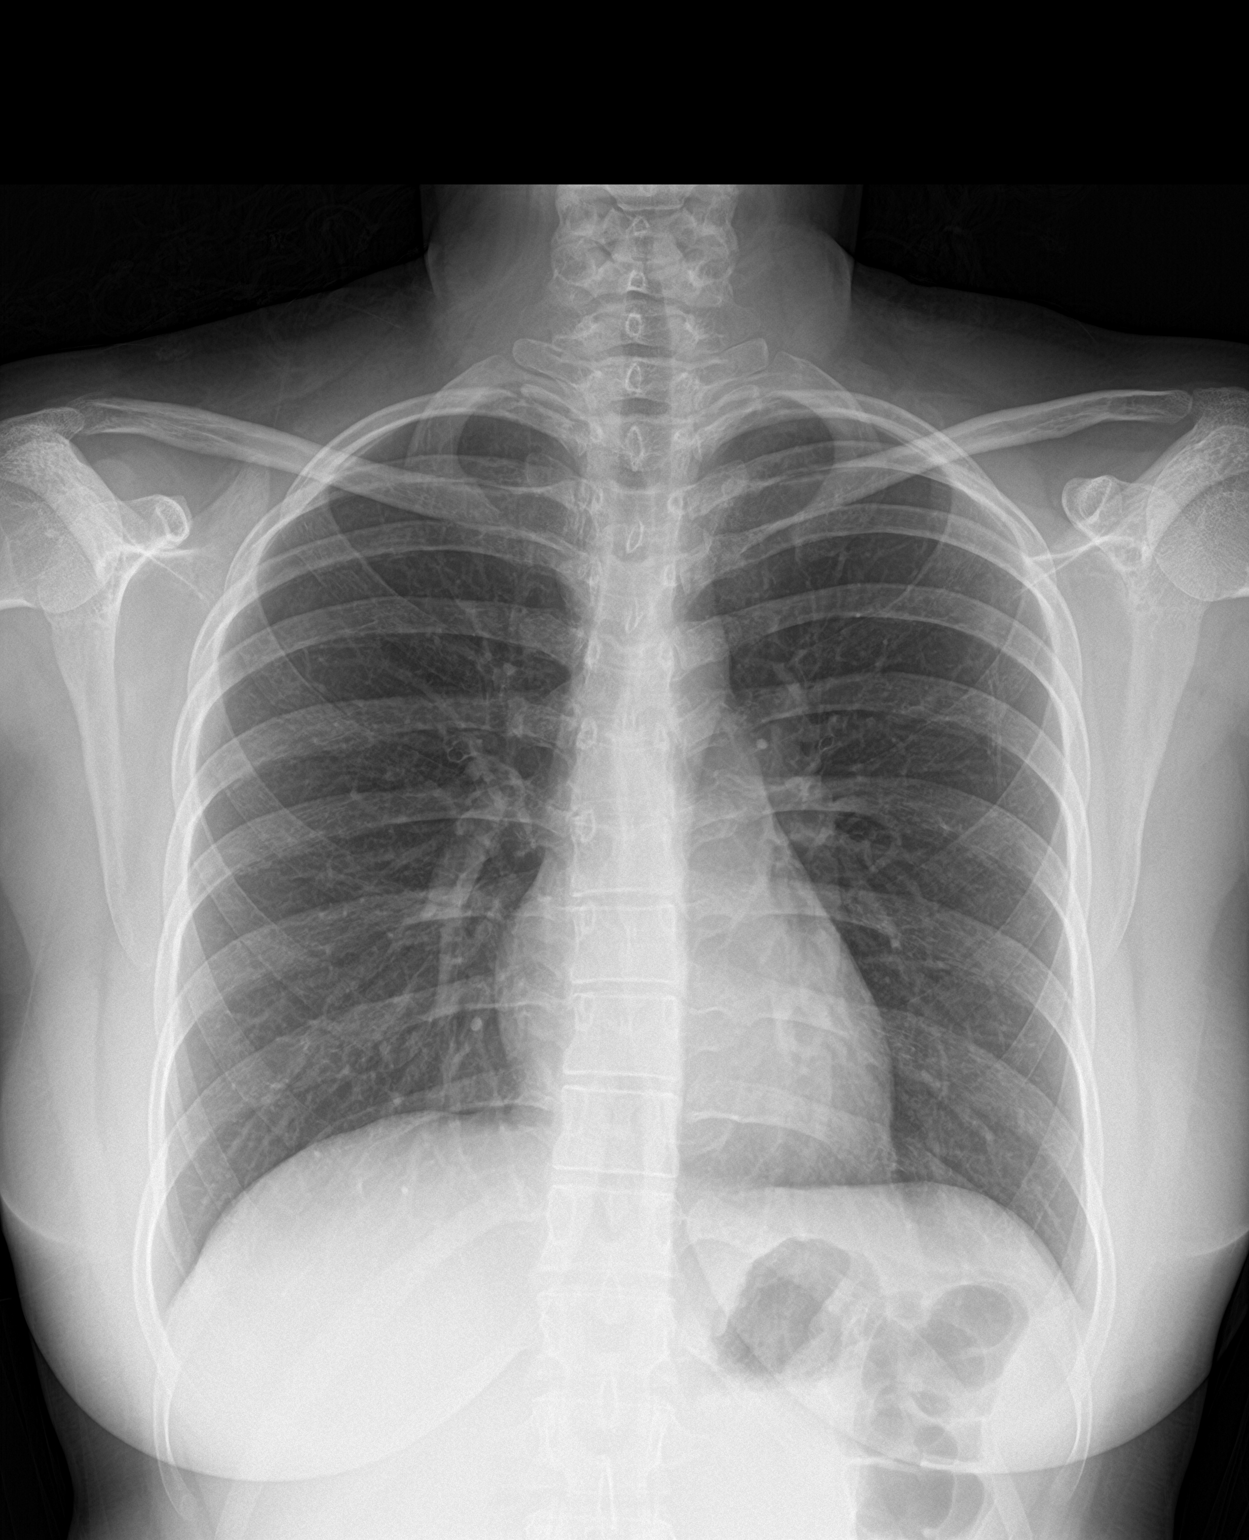

[chest lat]
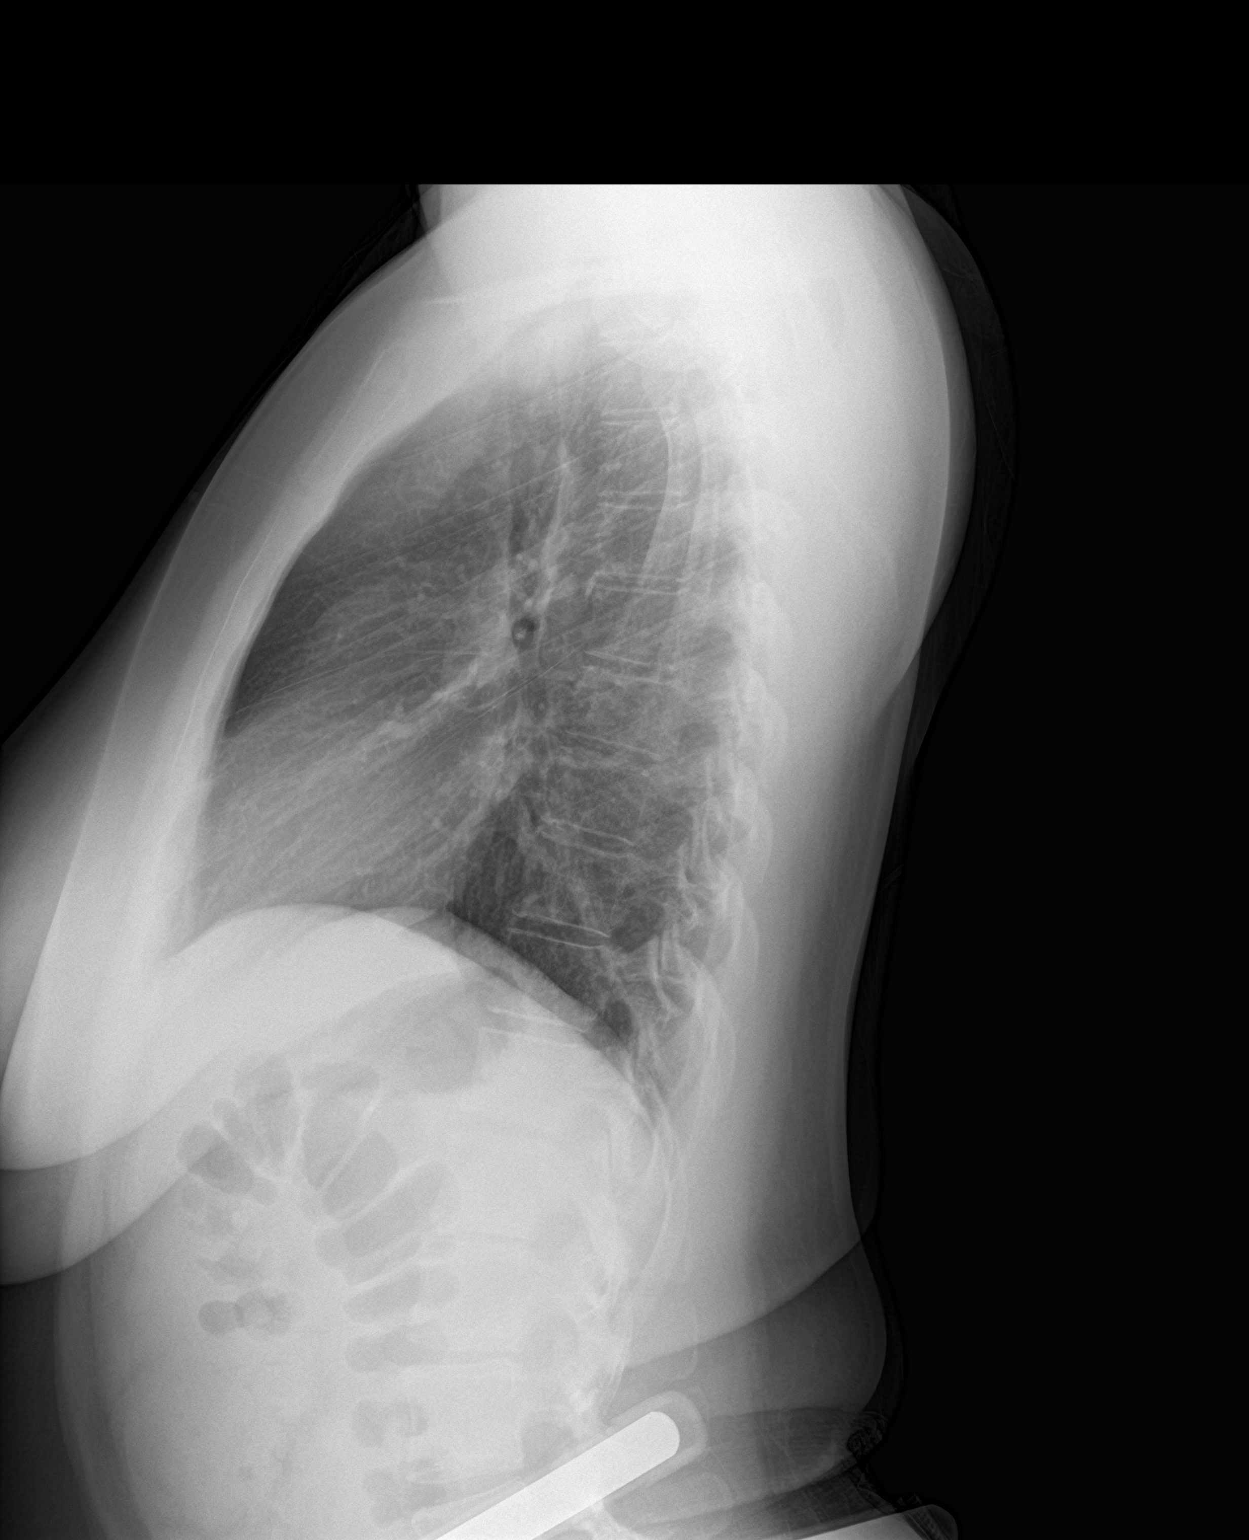

[2 of 2 positions shown; findings below may reference images not displayed]

FINDINGS: The heart size and mediastinal contours are within normal limits.
Vertically oriented opacity in the left upper lung indeterminate for
an artifact or possible small area of scarring. The visualized
skeletal structures are unremarkable.
IMPRESSION: No active cardiopulmonary disease.

## 2020-07-11 MED ORDER — ALBUTEROL SULFATE HFA 108 (90 BASE) MCG/ACT IN AERS: 1.0000 | INHALATION_SPRAY | Freq: Four times a day (QID) | RESPIRATORY_TRACT | 0 refills | Status: AC | PRN

## 2020-07-11 NOTE — ED Provider Notes (Signed)
[facility]   MRN: [id number] DOB: 07-07-2000  Subjective:   Katherine Sparks is a 20 y.o. female presenting for several month history of shortness of breath worse in the past week with associated cough.  Symptoms worsened since she was discharged from the hospital on 07/07/2020.  Was admitted for clinical diagnosis of a TIA.  Denies any changes to her symptoms.  No current facility-administered medications for this encounter.  Current Outpatient Medications:    estradiol (CLIMARA - DOSED IN MG/24 HR) 0.1 mg/24hr patch, Place 0.1 mg onto the skin every Sunday., Disp: , Rfl:    progesterone (PROMETRIUM) 200 MG capsule, Take 200 mg by mouth See admin instructions. Take one tablet (200 mg) by mouth at bedtime 12 nights out of every month, Disp: , Rfl:    No Known Allergies  Past Medical History:  Diagnosis Date   TIA (transient ischemic attack)      History reviewed. No pertinent surgical history.  Family History  Problem Relation Age of Onset   Hypertension Mother    Heart attack Paternal Grandfather    Heart murmur Maternal Aunt     Social History   Tobacco Use   Smoking status: Never   Smokeless tobacco: Never  Vaping Use   Vaping Use: Never used  Substance Use Topics   Alcohol use: Yes   Drug use: Yes    Frequency: 7.0 times per week    Types: Marijuana    ROS   Objective:   Vitals: BP 113/76 (BP Location: Left Arm)   Pulse 75   Temp 99 F (37.2 C) (Oral)   Resp 18   SpO2 100%   Physical Exam Constitutional:      General: She is not in acute distress.    Appearance: Normal appearance. She is well-developed. She is not ill-appearing, toxic-appearing or diaphoretic.  HENT:     Head: Normocephalic and atraumatic.     Nose: Nose normal.     Mouth/Throat:     Mouth: Mucous membranes are moist.  Eyes:     Extraocular Movements: Extraocular movements intact.     Pupils: Pupils are equal, round, and reactive to light.  Cardiovascular:      Rate and Rhythm: Normal rate and regular rhythm.     Pulses: Normal pulses.     Heart sounds: Normal heart sounds. No murmur heard.   No friction rub. No gallop.  Pulmonary:     Effort: Pulmonary effort is normal. No respiratory distress.     Breath sounds: Normal breath sounds. No stridor. No wheezing, rhonchi or rales.  Skin:    General: Skin is warm and dry.     Findings: No rash.  Neurological:     Mental Status: She is alert and oriented to person, place, and time.  Psychiatric:        Mood and Affect: Mood normal.        Behavior: Behavior normal.        Thought Content: Thought content normal.    DG Chest 2 View  Result Date: 07/11/2020 CLINICAL DATA:  Cough and short of breath EXAM: CHEST - 2 VIEW COMPARISON:  None. FINDINGS: The heart size and mediastinal contours are within normal limits. Vertically oriented opacity in the left upper lung indeterminate for an artifact or possible small area of scarring. The visualized skeletal structures are unremarkable. IMPRESSION: No active cardiopulmonary disease. Electronically Signed   By: Jasmine Pang M.D.   On: 07/11/2020 15:24  MR BRAIN WO CONTRAST  Result Date: 07/06/2020 CLINICAL DATA:  Stroke follow-up. Right-sided weakness and numbness. EXAM: MRI HEAD WITHOUT CONTRAST TECHNIQUE: Multiplanar, multiecho pulse sequences of the brain and surrounding structures were obtained without intravenous contrast. COMPARISON:  Head CT, CTA, and CTP 07/05/2020 FINDINGS: Brain: There is no evidence of an acute infarct, intracranial hemorrhage, mass, midline shift, or extra-axial fluid collection. The ventricles and sulci are normal. The brain is normal in signal. Vascular: Major intracranial vascular flow voids are preserved. Skull and upper cervical spine: Unremarkable bone marrow signal. Sinuses/Orbits: Unremarkable orbits. Mild mucosal thickening in the paranasal sinuses. Trace right mastoid fluid. Other: None. IMPRESSION: Unremarkable  appearance of the brain. Electronically Signed   By: Sebastian Ache M.D.   On: 07/06/2020 14:05   MR Venogram Head  Result Date: 07/06/2020 CLINICAL DATA:  Stroke follow-up. Right-sided weakness and numbness. EXAM: MR VENOGRAM HEAD WITHOUT AND WITH CONTRAST TECHNIQUE: Angiographic images of the intracranial venous structures were acquired using MRV technique without and with intravenous contrast. CONTRAST:  67mL GADAVIST GADOBUTROL 1 MMOL/ML IV SOLN COMPARISON:  Head CTA 07/05/2020 and head MRI 07/06/2020 FINDINGS: The superior sagittal sinus, internal cerebral veins, vein of Galen, straight sinus, transverse sinuses, sigmoid sinuses, and jugular bulbs are patent without convincing thrombus. The right transverse and sigmoid sinuses are mildly dominant. There is moderate multifocal narrowing of the midportion of the superior sagittal sinus with the appearance of both internal filling defects as well as compression by extrinsic structures on source images. Comparison with today's brain MRI demonstrates multiple lobular T2 hyperintense structures on both sides of the sinus compressing and extending into the sinus, and the overall appearance is most consistent with multiple prominent arachnoid granulations. IMPRESSION: 1. No evidence of dural venous sinus thrombosis. 2. 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 Electronically Signed   By: Sebastian Ache M.D.   On: 07/06/2020 14:12   CT CEREBRAL PERFUSION W CONTRAST  Result Date: 07/05/2020 CLINICAL DATA:  Stroke/TIA, assess intracranial arteries. Stroke/TIA, assess extracranial arteries. EXAM: CT ANGIOGRAPHY HEAD AND NECK CT PERFUSION BRAIN TECHNIQUE: Multidetector CT imaging of the head and neck was performed using the standard protocol during bolus administration of intravenous contrast. Multiplanar CT image reconstructions and MIPs were obtained to evaluate the vascular anatomy. Carotid stenosis  measurements (when applicable) are obtained utilizing NASCET criteria, using the distal internal carotid diameter as the denominator. Multiphase CT imaging of the brain was performed following IV bolus contrast injection. Subsequent parametric perfusion maps were calculated using RAPID software. CONTRAST:  13mL OMNIPAQUE IOHEXOL 350 MG/ML SOLN, OMNIPAQUE IOHEXOL 350 MG/ML SOLN; OMNIPAQUE IOHEXOL 350 MG/ML SOLN COMPARISON:  Noncontrast head CT performed earlier today 07/05/2020. FINDINGS: CTA NECK FINDINGS Aortic arch: The visualized aortic arch is normal in caliber. Standard aortic branching. Streak artifact from a dense left-sided contrast bolus partially obscures the left subclavian artery. Within this limitation, no appreciable hemodynamically significant innominate or proximal subclavian artery stenosis. Right carotid system: CCA and ICA patent within the neck without stenosis or significant atherosclerotic disease. Left carotid system: CCA and ICA patent within the neck without stenosis or significant atherosclerotic disease. Vertebral arteries: Vertebral arteries codominant and patent within the neck without stenosis or atherosclerotic disease. Skeleton: No acute bony abnormality or aggressive osseous lesion. Reversal of the expected cervical lordosis. Other neck: No neck mass or cervical lymphadenopathy. Upper chest: No consolidation within the imaged lung apices. Review of the MIP images confirms the above findings CTA HEAD  FINDINGS Anterior circulation: The intracranial internal carotid arteries are patent. The M1 middle cerebral arteries are patent. No M2 proximal branch occlusion or high-grade proximal stenosis is identified. The anterior cerebral arteries are patent. Posterior circulation: The intracranial vertebral arteries are patent. The basilar artery is patent. The posterior cerebral arteries are patent. Venous sinuses: Within the limitations of contrast timing, no convincing thrombus.  Anatomic variants: Posterior communicating arteries are hypoplastic or absent bilaterally. Review of the MIP images confirms the above findings CT Brain Perfusion Findings: Inadequate contrast bolus or contrast timing, limiting reliability CBF (<30%) Volume: 0mL Perfusion (Tmax>6.0s) volume: 0mL Mismatch Volume: 0mL Infarction Location:None identified No intracranial large vessel occlusion is identified. These results were called by telephone at the time of interpretation on 07/05/2020 at 6:39 pm to provider [facility] , who verbally acknowledged these results. IMPRESSION: CTA neck: The common carotid, internal carotid and vertebral arteries are patent within the neck without stenosis. CTA head: No intracranial large vessel occlusion or proximal high-grade arterial stenosis identified. CT perfusion head: Inadequate contrast bolus or contrast timing, limiting reliability of the perfusion data. Within this limitation, the perfusion software identifies no core infarct or critically hypoperfused parenchyma. Electronically Signed   By: Jackey Loge DO   On: 07/05/2020 18:40   ECHOCARDIOGRAM COMPLETE BUBBLE STUDY  Result Date: 07/06/2020    ECHOCARDIOGRAM REPORT   Patient Name:   Katherine Sparks Date of Exam: 07/06/2020 Medical Rec #:  [id number]        Height: Accession #:    [id number]       Weight:       134.9 lb Date of Birth:  [birth year]        BSA:          1.553 m Patient Age:    20 years         BP:           88/57 mmHg Patient Gender: F                HR:           67 bpm. Exam Location:  Inpatient Procedure: 2D Echo, 3D Echo, Cardiac Doppler, Color Doppler and Saline Contrast            Bubble Study Indications:    Stroke  History:        Patient has no prior history of Echocardiogram examinations.                 TIA. No prior cardiac history.  Sonographer:    Sheralyn Boatman RDCS Referring Phys: [id number] [facility] IMPRESSIONS  1. 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  2. Left ventricular ejection fraction, by estimation, is 55 to 60%. The left ventricle has normal function. The left ventricle has no regional wall motion abnormalities. Left ventricular diastolic parameters were normal.  3. Right ventricular systolic function is normal. The right ventricular size is normal. Tricuspid regurgitation signal is inadequate for assessing PA pressure.  4. The mitral valve is normal in structure. Trivial mitral valve regurgitation. No evidence of mitral stenosis.  5. The aortic valve is tricuspid. Aortic valve regurgitation is not visualized. No aortic stenosis is present.  6. The inferior vena cava is normal in size with greater than 50% respiratory variability, suggesting right atrial pressure of 3 mmHg.  7. Agitated saline contrast bubble study was positive with shunting observed within 3-6 cardiac cycles suggestive of interatrial shunt. FINDINGS  Left Ventricle: Left  ventricular ejection fraction, by estimation, is 55 to 60%. The left ventricle has normal function. The left ventricle has no regional wall motion abnormalities. The left ventricular internal cavity size was normal in size. There is  no left ventricular hypertrophy. Left ventricular diastolic parameters were normal. Right Ventricle: The right ventricular size is normal. Right ventricular systolic function is normal. Tricuspid regurgitation signal is inadequate for assessing PA pressure. The tricuspid regurgitant velocity is 2.05 m/s, and with an assumed right atrial  pressure of 3 mmHg, the estimated right ventricular systolic pressure is 19.8 mmHg. Left Atrium: Left atrial size was normal in size. Right Atrium: Right atrial size was normal in size. Pericardium: There is no evidence of pericardial effusion. Mitral Valve: The mitral valve is normal in structure. Trivial mitral valve regurgitation. No evidence of mitral valve stenosis. Tricuspid Valve: The tricuspid valve is normal in structure. Tricuspid valve  regurgitation is mild . No evidence of tricuspid stenosis. Aortic Valve: The aortic valve is tricuspid. Aortic valve regurgitation is not visualized. No aortic stenosis is present. Pulmonic Valve: The pulmonic valve was normal in structure. Pulmonic valve regurgitation is not visualized. No evidence of pulmonic stenosis. Aorta: The aortic root is normal in size and structure. Venous: The inferior vena cava is normal in size with greater than 50% respiratory variability, suggesting right atrial pressure of 3 mmHg. IAS/Shunts: No atrial level shunt detected by color flow Doppler. Agitated saline contrast was given intravenously to evaluate for intracardiac shunting. Agitated saline contrast bubble study was positive with shunting observed within 3-6 cardiac cycles suggestive of interatrial shunt. Additional Comments: Positive saline microcavitation study suggestive of patent foramen ovale.  LEFT VENTRICLE PLAX 2D LVIDd:         4.30 cm     Diastology LVIDs:         2.90 cm     LV e' medial:    12.00 cm/s LV PW:         0.70 cm     LV E/e' medial:  6.9 LV IVS:        0.60 cm     LV e' lateral:   23.90 cm/s LVOT diam:     1.80 cm     LV E/e' lateral: 3.5 LV SV:         67 LV SV Index:   43 LVOT Area:     2.54 cm  LV Volumes (MOD) LV vol d, MOD A2C: 89.5 ml LV vol d, MOD A4C: 69.1 ml LV vol s, MOD A2C: 29.5 ml LV vol s, MOD A4C: 30.2 ml LV SV MOD A2C:     60.0 ml LV SV MOD A4C:     69.1 ml LV SV MOD BP:      53.1 ml RIGHT VENTRICLE             IVC RV S prime:     12.30 cm/s  IVC diam: 2.00 cm TAPSE (M-mode): 1.8 cm LEFT ATRIUM             Index       RIGHT ATRIUM          Index LA diam:        2.60 cm 1.67 cm/m  RA Area:     6.46 cm LA Vol (A2C):   19.8 ml 12.75 ml/m RA Volume:   8.78 ml  5.65 ml/m LA Vol (A4C):   14.1 ml 9.08 ml/m LA Biplane Vol: 16.8 ml 10.82 ml/m  AORTIC VALVE LVOT  Vmax:   124.00 cm/s LVOT Vmean:  84.900 cm/s LVOT VTI:    0.265 m  AORTA Ao Root diam: 2.80 cm Ao Asc diam:  2.70 cm MITRAL VALVE                TRICUSPID VALVE MV Area (PHT): 3.88 cm    TR Peak grad:   16.8 mmHg MV Decel Time: 196 msec    TR Vmax:        205.00 cm/s MV E velocity: 83.35 cm/s MV A velocity: 48.45 cm/s  SHUNTS MV E/A ratio:  1.72        Systemic VTI:  0.26 m                            Systemic Diam: 1.80 cm Olga MillersBrian Crenshaw MD Electronically signed by Olga MillersBrian Crenshaw MD Signature Date/Time: 07/06/2020/2:57:25 PM    Final    CT HEAD CODE STROKE WO CONTRAST  Result Date: 07/05/2020 CLINICAL DATA:  Code stroke. Neuro deficit, acute, stroke suspected. Right-sided drift. EXAM: CT HEAD WITHOUT CONTRAST TECHNIQUE: Contiguous axial images were obtained from the base of the skull through the vertex without intravenous contrast. COMPARISON:  No pertinent prior exams available for comparison. FINDINGS: Brain: Cerebral volume is normal. There is no acute intracranial hemorrhage. No demarcated cortical infarct. No extra-axial fluid collection. No evidence of an intracranial mass. No midline shift. Vascular: No hyperdense vessel. Skull: Normal. Negative for fracture or focal lesion. Sinuses/Orbits: Visualized orbits show no acute finding. No significant paranasal sinus disease at the imaged levels. ASPECTS ([place] Stroke Program Early CT Score) - Ganglionic level infarction (caudate, lentiform nuclei, internal capsule, insula, M1-M3 cortex): 7 - Supraganglionic infarction (M4-M6 cortex): 3 Total score (0-10 with 10 being normal): 10 These results were called by telephone at the time of interpretation on 07/05/2020 at 5:51 pm to provider Dr. Derry LoryKhaliqdina, who verbally acknowledged these results. IMPRESSION: No evidence of acute intracranial abnormality.  ASPECTS is 10. Electronically Signed   By: Jackey LogeKyle  Golden DO   On: 07/05/2020 17:54   CT ANGIO HEAD CODE STROKE  Result Date: 07/05/2020 CLINICAL DATA:  Stroke/TIA, assess intracranial arteries. Stroke/TIA, assess extracranial arteries. EXAM: CT ANGIOGRAPHY HEAD AND NECK CT PERFUSION BRAIN  TECHNIQUE: Multidetector CT imaging of the head and neck was performed using the standard protocol during bolus administration of intravenous contrast. Multiplanar CT image reconstructions and MIPs were obtained to evaluate the vascular anatomy. Carotid stenosis measurements (when applicable) are obtained utilizing NASCET criteria, using the distal internal carotid diameter as the denominator. Multiphase CT imaging of the brain was performed following IV bolus contrast injection. Subsequent parametric perfusion maps were calculated using RAPID software. CONTRAST:  50mL OMNIPAQUE IOHEXOL 350 MG/ML SOLN, 100mL OMNIPAQUE IOHEXOL 350 MG/ML SOLN; 100mL OMNIPAQUE IOHEXOL 350 MG/ML SOLN COMPARISON:  Noncontrast head CT performed earlier today 07/05/2020. FINDINGS: CTA NECK FINDINGS Aortic arch: The visualized aortic arch is normal in caliber. Standard aortic branching. Streak artifact from a dense left-sided contrast bolus partially obscures the left subclavian artery. Within this limitation, no appreciable hemodynamically significant innominate or proximal subclavian artery stenosis. Right carotid system: CCA and ICA patent within the neck without stenosis or significant atherosclerotic disease. Left carotid system: CCA and ICA patent within the neck without stenosis or significant atherosclerotic disease. Vertebral arteries: Vertebral arteries codominant and patent within the neck without stenosis or atherosclerotic disease. Skeleton: No acute bony abnormality or aggressive osseous lesion. Reversal of the expected cervical lordosis.  Other neck: No neck mass or cervical lymphadenopathy. Upper chest: No consolidation within the imaged lung apices. Review of the MIP images confirms the above findings CTA HEAD FINDINGS Anterior circulation: The intracranial internal carotid arteries are patent. The M1 middle cerebral arteries are patent. No M2 proximal branch occlusion or high-grade proximal stenosis is identified. The  anterior cerebral arteries are patent. Posterior circulation: The intracranial vertebral arteries are patent. The basilar artery is patent. The posterior cerebral arteries are patent. Venous sinuses: Within the limitations of contrast timing, no convincing thrombus. Anatomic variants: Posterior communicating arteries are hypoplastic or absent bilaterally. Review of the MIP images confirms the above findings CT Brain Perfusion Findings: Inadequate contrast bolus or contrast timing, limiting reliability CBF (<30%) Volume: 0mL Perfusion (Tmax>6.0s) volume: 0mL Mismatch Volume: 0mL Infarction Location:None identified No intracranial large vessel occlusion is identified. These results were called by telephone at the time of interpretation on 07/05/2020 at 6:39 pm to provider [facility] , who verbally acknowledged these results. IMPRESSION: CTA neck: The common carotid, internal carotid and vertebral arteries are patent within the neck without stenosis. CTA head: No intracranial large vessel occlusion or proximal high-grade arterial stenosis identified. CT perfusion head: Inadequate contrast bolus or contrast timing, limiting reliability of the perfusion data. Within this limitation, the perfusion software identifies no core infarct or critically hypoperfused parenchyma. Electronically Signed   By: Jackey Loge DO   On: 07/05/2020 18:40   CT ANGIO NECK CODE STROKE  Result Date: 07/05/2020 CLINICAL DATA:  Stroke/TIA, assess intracranial arteries. Stroke/TIA, assess extracranial arteries. EXAM: CT ANGIOGRAPHY HEAD AND NECK CT PERFUSION BRAIN TECHNIQUE: Multidetector CT imaging of the head and neck was performed using the standard protocol during bolus administration of intravenous contrast. Multiplanar CT image reconstructions and MIPs were obtained to evaluate the vascular anatomy. Carotid stenosis measurements (when applicable) are obtained utilizing NASCET criteria, using the distal internal carotid diameter as  the denominator. Multiphase CT imaging of the brain was performed following IV bolus contrast injection. Subsequent parametric perfusion maps were calculated using RAPID software. CONTRAST:  50mL OMNIPAQUE IOHEXOL 350 MG/ML SOLN, OMNIPAQUE IOHEXOL 350 MG/ML SOLN; OMNIPAQUE IOHEXOL 350 MG/ML SOLN COMPARISON:  Noncontrast head CT performed earlier today 07/05/2020. FINDINGS: CTA NECK FINDINGS Aortic arch: The visualized aortic arch is normal in caliber. Standard aortic branching. Streak artifact from a dense left-sided contrast bolus partially obscures the left subclavian artery. Within this limitation, no appreciable hemodynamically significant innominate or proximal subclavian artery stenosis. Right carotid system: CCA and ICA patent within the neck without stenosis or significant atherosclerotic disease. Left carotid system: CCA and ICA patent within the neck without stenosis or significant atherosclerotic disease. Vertebral arteries: Vertebral arteries codominant and patent within the neck without stenosis or atherosclerotic disease. Skeleton: No acute bony abnormality or aggressive osseous lesion. Reversal of the expected cervical lordosis. Other neck: No neck mass or cervical lymphadenopathy. Upper chest: No consolidation within the imaged lung apices. Review of the MIP images confirms the above findings CTA HEAD FINDINGS Anterior circulation: The intracranial internal carotid arteries are patent. The M1 middle cerebral arteries are patent. No M2 proximal branch occlusion or high-grade proximal stenosis is identified. The anterior cerebral arteries are patent. Posterior circulation: The intracranial vertebral arteries are patent. The basilar artery is patent. The posterior cerebral arteries are patent. Venous sinuses: Within the limitations of contrast timing, no convincing thrombus. Anatomic variants: Posterior communicating arteries are hypoplastic or absent bilaterally. Review of the MIP images  confirms the above findings CT  Brain Perfusion Findings: Inadequate contrast bolus or contrast timing, limiting reliability CBF (<30%) Volume: 80mL Perfusion (Tmax>6.0s) volume: 26mL Mismatch Volume: 58mL Infarction Location:None identified No intracranial large vessel occlusion is identified. These results were called by telephone at the time of interpretation on 07/05/2020 at 6:39 pm to provider [facility] , who verbally acknowledged these results. IMPRESSION: CTA neck: The common carotid, internal carotid and vertebral arteries are patent within the neck without stenosis. CTA head: No intracranial large vessel occlusion or proximal high-grade arterial stenosis identified. CT perfusion head: Inadequate contrast bolus or contrast timing, limiting reliability of the perfusion data. Within this limitation, the perfusion software identifies no core infarct or critically hypoperfused parenchyma. Electronically Signed   By: Jackey Loge DO   On: 07/05/2020 18:40     Recent Results (from the past 2160 hour(s))  Ethanol     Status: None   Collection Time: 07/05/20  5:36 PM  Result Value Ref Range   Alcohol, Ethyl (B) <10 <10 mg/dL    Comment: (NOTE) Lowest detectable limit for serum alcohol is 10 mg/dL.  For medical purposes only. Performed at [facility], [address], [place], [place] [address]   Protime-INR     Status: None   Collection Time: 07/05/20  5:36 PM  Result Value Ref Range   Prothrombin Time 13.4 11.4 - 15.2 seconds   INR 1.0 0.8 - 1.2    Comment: (NOTE) INR goal varies based on device and disease states. Performed at [facility], [address] [place], [place] [address]   APTT     Status: None   Collection Time: 07/05/20  5:36 PM  Result Value Ref Range   aPTT 33 24 - 36 seconds    Comment: Performed at [facility], [address] [place], [place] [address]  CBC     Status: None   Collection Time: 07/05/20  5:36 PM  Result Value Ref Range   WBC  6.0 4.0 - 10.5 K/uL   RBC 4.85 3.87 - 5.11 MIL/uL   Hemoglobin 14.2 12.0 - 15.0 g/dL   HCT 92.1 19.4 - 17.4 %   MCV 88.5 80.0 - 100.0 fL   MCH 29.3 26.0 - 34.0 pg   MCHC 33.1 30.0 - 36.0 g/dL   RDW 08.1 44.8 - 18.5 %   Platelets 322 150 - 400 K/uL   nRBC 0.0 0.0 - 0.2 %    Comment: Performed at [facility], [address] [place], [place] [address]  Differential     Status: None   Collection Time: 07/05/20  5:36 PM  Result Value Ref Range   Neutrophils Relative % 56 %   Neutro Abs 3.3 1.7 - 7.7 K/uL   Lymphocytes Relative 31 %   Lymphs Abs 1.9 0.7 - 4.0 K/uL   Monocytes Relative 10 %   Monocytes Absolute 0.6 0.1 - 1.0 K/uL   Eosinophils Relative 2 %   Eosinophils Absolute 0.1 0.0 - 0.5 K/uL   Basophils Relative 1 %   Basophils Absolute 0.0 0.0 - 0.1 K/uL   Immature Granulocytes 0 %   Abs Immature Granulocytes 0.01 0.00 - 0.07 K/uL    Comment: Performed at [facility], [address], [place], [place] [address]  Comprehensive metabolic panel     Status: Abnormal   Collection Time: 07/05/20  5:36 PM  Result Value Ref Range   Sodium 137 135 - 145 mmol/L   Potassium 3.3 (  L) 3.5 - 5.1 mmol/L   Chloride 105 98 - 111 mmol/L   CO2 28 22 - 32 mmol/L   Glucose, Bld 98 70 - 99 mg/dL    Comment: Glucose reference range applies only to samples taken after fasting for at least 8 hours.   BUN 9 6 - 20 mg/dL   Creatinine, Ser 8.11 0.44 - 1.00 mg/dL   Calcium 9.2 8.9 - 91.4 mg/dL   Total Protein 8.2 (H) 6.5 - 8.1 g/dL   Albumin 4.2 3.5 - 5.0 g/dL   AST 19 15 - 41 U/L   ALT 14 0 - 44 U/L   Alkaline Phosphatase 82 38 - 126 U/L   Total Bilirubin 0.8 0.3 - 1.2 mg/dL   GFR, Estimated >78 >29 mL/min    Comment: (NOTE) Calculated using the CKD-EPI Creatinine Equation (2021)    Anion gap 4 (L) 5 - 15    Comment: Performed at [facility], [address], [place], [place] [address]  I-Stat beta hCG blood, ED     Status: None   Collection Time: 07/05/20  5:46 PM  Result  Value Ref Range   I-stat hCG, quantitative <5.0 <5 mIU/mL   Comment 3            Comment:   GEST. AGE      CONC.  (mIU/mL)   <=1 WEEK        5 - 50     2 WEEKS       50 - 500     3 WEEKS       100 - 10,000     4 WEEKS     1,000 - 30,000        FEMALE AND NON-PREGNANT FEMALE:     LESS THAN 5 mIU/mL   I-stat chem 8, ED     Status: Abnormal   Collection Time: 07/05/20  5:47 PM  Result Value Ref Range   Sodium 141 135 - 145 mmol/L   Potassium 3.4 (L) 3.5 - 5.1 mmol/L   Chloride 104 98 - 111 mmol/L   BUN 9 6 - 20 mg/dL   Creatinine, Ser 0.86 0.44 - 1.00 mg/dL   Glucose, Bld 95 70 - 99 mg/dL    Comment: Glucose reference range applies only to samples taken after fasting for at least 8 hours.   Calcium, Ion 1.25 1.15 - 1.40 mmol/L   TCO2 24 22 - 32 mmol/L   Hemoglobin 15.0 12.0 - 15.0 g/dL   HCT 57.8 46.9 - 62.9 %  CBG monitoring, ED     Status: None   Collection Time: 07/05/20  5:50 PM  Result Value Ref Range   Glucose-Capillary 74 70 - 99 mg/dL    Comment: Glucose reference range applies only to samples taken after fasting for at least 8 hours.  MRSA Next Gen by PCR, Nasal     Status: None   Collection Time: 07/05/20  8:02 PM   Specimen: Nasal Mucosa; Nasal Swab  Result Value Ref Range   MRSA by PCR Next Gen NOT DETECTED NOT DETECTED    Comment: (NOTE) The GeneXpert MRSA Assay (FDA approved for NASAL specimens only), is one component of a comprehensive MRSA colonization surveillance program. It is not intended to diagnose MRSA infection nor to guide or monitor treatment for MRSA infections. Test performance is not FDA approved in patients less than 36 years old. Performed at [facility], [address], [place], [place] [address]  HIV Antibody (routine testing w rflx)     Status: None   Collection Time: 07/05/20  8:14 PM  Result Value Ref Range   HIV Screen 4th Generation wRfx Non Reactive Non Reactive    Comment: Performed at [facility], [address], [place], [place] [address]  ANA w/Reflex if Positive     Status: None   Collection Time: 07/05/20  8:14 PM  Result Value Ref Range   Anti Nuclear Antibody (ANA) Negative Negative    Comment: (NOTE) Performed At: [facility] [address] [place], [place] [id number] Jolene Schimke MD [id number]   ANCA Titers     Status: None   Collection Time: 07/05/20  8:14 PM  Result Value Ref Range   C-ANCA <1:20 Neg:<1:20 titer   P-ANCA <1:20 Neg:<1:20 titer    Comment: (NOTE) The presence of positive fluorescence exhibiting P-ANCA or C-ANCA patterns alone is not specific for the diagnosis of Wegener's Granulomatosis (WG) or microscopic polyangiitis. Decisions about treatment should not be based solely on ANCA IFA results.  The [employer] ANCA [employer] Consensus recommends follow up testing of positive sera with both PR-3 and MPO-ANCA enzyme immunoassays. As many as 5% serum samples are positive only by EIA. Ref. AM J Clin Pathol 1999;111:507-513.    Atypical P-ANCA titer <1:20 Neg:<1:20 titer    Comment: (NOTE) The atypical pANCA pattern has been observed in a significant percentage of patients with ulcerative colitis, primary sclerosing cholangitis and autoimmune hepatitis. Performed At: [facility] [address] [place], [place] [id number] Jolene Schimke MD IH:[id number]   Antiphospholipid syndrome eval, bld     Status: Abnormal   Collection Time: 07/05/20  8:14 PM  Result Value Ref Range   Anticardiolipin IgA 10 0 - 11 APL U/mL    Comment: (NOTE)                          Negative:              <12                          Indeterminate:     12 - 20                          Low-Med Positive: >20 - 80                          High Positive:         >80 Performed At: [facility] [address] [place], [place] [id number] Jolene Schimke MD [id number]    Anticardiolipin IgG 15 (H) 0 - 14 GPL U/mL    Comment: (NOTE)                          Negative:               <15                          Indeterminate:     15 - 20                          Low-Med Positive: >20 - 80  High Positive:         >80    Anticardiolipin IgM 21 (H) 0 - 12 MPL U/mL    Comment: (NOTE)                          Negative:              <13                          Indeterminate:     13 - 20                          Low-Med Positive: >20 - 80                          High Positive:         >80    PTT Lupus Anticoagulant 37.8 0.0 - 51.9 sec   DRVVT 46.7 0.0 - 47.0 sec   Phosphatydalserine, IgG 9 0 - 30 Units   Phosphatydalserine, IgM 14 0 - 30 Units   Phosphatydalserine, IgA 1 0 - 19 APS Units   Lupus Anticoag Interp Comment:     Comment: No lupus anticoagulant was detected.  Antithrombin III     Status: None   Collection Time: 07/05/20  8:14 PM  Result Value Ref Range   AntiThromb III Func 120 75 - 120 %    Comment: Performed at [facility], [address] [place], [place] [address]  Anti-DNA antibody, double-stranded     Status: None   Collection Time: 07/05/20  8:14 PM  Result Value Ref Range   ds DNA Ab <1 0 - 9 IU/mL    Comment: (NOTE)                                   Negative      <5                                   Equivocal  5 - 9                                   Positive      >9 Performed At: [facility] [address] [place], [place] [id number] Jolene Schimke MD [id number]   Sedimentation rate     Status: Abnormal   Collection Time: 07/05/20  8:14 PM  Result Value Ref Range   Sed Rate 32 (H) 0 - 22 mm/hr    Comment: Performed at [facility], [address] [place], [place] [address]  C-reactive protein     Status: Abnormal   Collection Time: 07/05/20  8:14 PM  Result Value Ref Range   CRP 1.7 (H) <1.0 mg/dL    Comment: Performed at [facility], [address], [place], [place] [address]  Hemoglobin A1c     Status: Abnormal   Collection Time: 07/06/20  4:35 AM  Result Value  Ref Range   Hgb A1c MFr Bld 4.7 (L) 4.8 - 5.6 %    Comment: (NOTE) Pre diabetes:  5.7%-6.4%  Diabetes:              >6.4%  Glycemic control for   <7.0% adults with diabetes    Mean Plasma Glucose 88.19 mg/dL    Comment: Performed at [facility], [address] [place], [place] [address]  Lipid panel     Status: None   Collection Time: 07/06/20  4:35 AM  Result Value Ref Range   Cholesterol 152 0 - 200 mg/dL   Triglycerides 48 <469 mg/dL   HDL 54 >62 mg/dL   Total CHOL/HDL Ratio 2.8 RATIO   VLDL 10 0 - 40 mg/dL   LDL Cholesterol 88 0 - 99 mg/dL    Comment:        Total Cholesterol/HDL:CHD Risk Coronary Heart Disease Risk Table                     Men   Women  1/2 Average Risk   3.4   3.3  Average Risk       5.0   4.4  2 X Average Risk   9.6   7.1  3 X Average Risk  23.4   11.0        Use the calculated Patient Ratio above and the CHD Risk Table to determine the patient's CHD Risk.        ATP III CLASSIFICATION (LDL):  <100     mg/dL   Optimal  952-841  mg/dL   Near or Above                    Optimal  130-159  mg/dL   Borderline  324-401  mg/dL   High  >027     mg/dL   Very High Performed at [facility], [address], [place], [place] [address]   Comprehensive metabolic panel     Status: Abnormal   Collection Time: 07/06/20  4:35 AM  Result Value Ref Range   Sodium 138 135 - 145 mmol/L   Potassium 3.6 3.5 - 5.1 mmol/L   Chloride 105 98 - 111 mmol/L   CO2 25 22 - 32 mmol/L   Glucose, Bld 88 70 - 99 mg/dL    Comment: Glucose reference range applies only to samples taken after fasting for at least 8 hours.   BUN 8 6 - 20 mg/dL   Creatinine, Ser 4.40 0.44 - 1.00 mg/dL   Calcium 9.1 8.9 - 34.7 mg/dL   Total Protein 6.6 6.5 - 8.1 g/dL   Albumin 3.3 (L) 3.5 - 5.0 g/dL   AST 20 15 - 41 U/L   ALT 8 0 - 44 U/L   Alkaline Phosphatase 65 38 - 126 U/L   Total Bilirubin 1.5 (H) 0.3 - 1.2 mg/dL   GFR, Estimated >42 >59 mL/min    Comment:  (NOTE) Calculated using the CKD-EPI Creatinine Equation (2021)    Anion gap 8 5 - 15    Comment: Performed at [facility], [address], [place], [place] [address]  CBC     Status: None   Collection Time: 07/06/20  4:35 AM  Result Value Ref Range   WBC 5.2 4.0 - 10.5 K/uL   RBC 4.61 3.87 - 5.11 MIL/uL   Hemoglobin 13.7 12.0 - 15.0 g/dL   HCT 56.4 33.2 - 95.1 %   MCV 86.1 80.0 - 100.0 fL   MCH 29.7 26.0 - 34.0 pg   MCHC 34.5 30.0 -  36.0 g/dL   RDW 16.1 09.6 - 04.5 %   Platelets 304 150 - 400 K/uL   nRBC 0.0 0.0 - 0.2 %    Comment: Performed at [facility], [address] [place], [place] [address]  Urine rapid drug screen (hosp performed)     Status: Abnormal   Collection Time: 07/06/20  8:51 AM  Result Value Ref Range   Opiates NONE DETECTED NONE DETECTED   Cocaine NONE DETECTED NONE DETECTED   Benzodiazepines NONE DETECTED NONE DETECTED   Amphetamines NONE DETECTED NONE DETECTED   Tetrahydrocannabinol POSITIVE (A) NONE DETECTED   Barbiturates NONE DETECTED NONE DETECTED    Comment: (NOTE) DRUG SCREEN FOR MEDICAL PURPOSES ONLY.  IF CONFIRMATION IS NEEDED FOR ANY PURPOSE, NOTIFY LAB WITHIN 5 DAYS.  LOWEST DETECTABLE LIMITS FOR URINE DRUG SCREEN Drug Class                     Cutoff (ng/mL) Amphetamine and metabolites    1000 Barbiturate and metabolites    200 Benzodiazepine                 200 Tricyclics and metabolites     300 Opiates and metabolites        300 Cocaine and metabolites        300 THC                            50 Performed at [facility], [address], [place], [place] [address]   Urinalysis, Complete w Microscopic Urine, Clean Catch     Status: Abnormal   Collection Time: 07/06/20  8:51 AM  Result Value Ref Range   Color, Urine YELLOW YELLOW   APPearance CLEAR CLEAR   Specific Gravity, Urine 1.025 1.005 - 1.030   pH 6.0 5.0 - 8.0   Glucose, UA NEGATIVE NEGATIVE mg/dL   Hgb urine dipstick TRACE (A) NEGATIVE   Bilirubin  Urine NEGATIVE NEGATIVE   Ketones, ur NEGATIVE NEGATIVE mg/dL   Protein, ur NEGATIVE NEGATIVE mg/dL   Nitrite NEGATIVE NEGATIVE   Leukocytes,Ua NEGATIVE NEGATIVE   Squamous Epithelial / LPF 6-10 0 - 5   WBC, UA 0-5 0 - 5 WBC/hpf   RBC / HPF 0-5 0 - 5 RBC/hpf   Bacteria, UA FEW (A) NONE SEEN   Mucus PRESENT     Comment: Performed at [facility], [address], [place], [place] [address]  Pregnancy, urine     Status: None   Collection Time: 07/06/20  8:51 AM  Result Value Ref Range   Preg Test, Ur NEGATIVE NEGATIVE    Comment:        THE SENSITIVITY OF THIS METHODOLOGY IS >20 mIU/mL. Performed at [facility], [address] [place], [place] [address]   ECHOCARDIOGRAM COMPLETE BUBBLE STUDY     Status: None   Collection Time: 07/06/20  2:36 PM  Result Value Ref Range   Single Plane A2C EF 67.0 %   Single Plane A4C EF 56.4 %   Calc EF 63.7 %   S' Lateral 2.90 cm   Area-P 1/2 3.88 cm2  Resp Panel by RT-PCR (Flu A&B, Covid)     Status: None   Collection Time: 07/07/20  6:23 AM  Result Value Ref Range   SARS Coronavirus 2 by RT PCR NEGATIVE NEGATIVE    Comment: (NOTE) SARS-CoV-2 target nucleic acids are NOT DETECTED.  The SARS-CoV-2 RNA is generally detectable in upper respiratory specimens during the acute phase of infection. The lowest concentration of SARS-CoV-2 viral copies this assay can detect is 138 copies/mL. A negative result does not preclude SARS-Cov-2 infection and should not be used as the sole basis for treatment or other patient management decisions. A negative result may occur with  improper specimen collection/handling, submission of specimen other than nasopharyngeal swab, presence of viral mutation(s) within the areas targeted by this assay, and inadequate number of viral copies(<138 copies/mL). A negative result must be combined with clinical observations, patient history, and epidemiological information. The expected result is  Negative.  Fact Sheet for Patients:  BloggerCourse.com  Fact Sheet for Healthcare Providers:  SeriousBroker.it  This test is no t yet approved or cleared by the [place] FDA and  has been authorized for detection and/or diagnosis of SARS-CoV-2 by FDA under an Emergency Use Authorization (EUA). This EUA will remain  in effect (meaning this test can be used) for the duration of the COVID-19 declaration under Section 564(b)(1) of the Act, 21 U.S.C.section 360bbb-3(b)(1), unless the authorization is terminated  or revoked sooner.       Influenza A by PCR NEGATIVE NEGATIVE   Influenza B by PCR NEGATIVE NEGATIVE    Comment: (NOTE) The Xpert Xpress SARS-CoV-2/FLU/RSV plus assay is intended as an aid in the diagnosis of influenza from Nasopharyngeal swab specimens and should not be used as a sole basis for treatment. Nasal washings and aspirates are unacceptable for Xpert Xpress SARS-CoV-2/FLU/RSV testing.  Fact Sheet for Patients: BloggerCourse.com  Fact Sheet for Healthcare Providers: SeriousBroker.it  This test is not yet approved or cleared by the [place] FDA and has been authorized for detection and/or diagnosis of SARS-CoV-2 by FDA under an Emergency Use Authorization (EUA). This EUA will remain in effect (meaning this test can be used) for the duration of the COVID-19 declaration under Section 564(b)(1) of the Act, 21 U.S.C. section 360bbb-3(b)(1), unless the authorization is terminated or revoked.  Performed at [facility], [address], [place], [place] [address]      Assessment and Plan :   PDMP not reviewed this encounter.  1. Shortness of breath   2. Cough     Medical records obtained and reviewed from her hospital admission.  Patient's daughter has a history of asthma, she tried her albuterol inhaler and states that it did help with her  shortness of breath.  Would like an inhaler of her own.  She does not have a PCP, would like information on how to get one.  She declined COVID-19 testing and I am in agreement as she was just tested while in the hospital and was negative.  I have low suspicion for pulmonary embolism given the timeline of her shortness of breath.  Discussed appropriate use of this inhaler. Counseled patient on potential for adverse effects with medications prescribed/recommended today, maintain strict ER and return-to-clinic precautions discussed, patient verbalized understanding.    Wallis Bamberg, [place] 07/11/20 1538

## 2020-07-11 NOTE — ED Triage Notes (Signed)
Pt reports shortness of breath, cough x 1 week; headache on and off x 1 year. Pt reports the the cough and shortness of breath is worse after been discharge from the hospital 4 days ago. Pt reports she was admitted x TIA . Denies any new tingling, numbness or weakness sensation. Albuterol inhaler gives relief.

## 2020-07-12 LAB — MTHFR DNA ANALYSIS

## 2020-07-12 LAB — FACTOR 5 LEIDEN

## 2020-09-25 DIAGNOSIS — G43109 Migraine with aura, not intractable, without status migrainosus: Secondary | ICD-10-CM

## 2020-10-11 ENCOUNTER — Other Ambulatory Visit: Payer: Self-pay

## 2020-10-11 NOTE — Patient Outreach (Signed)
[facility]) Care Management  10/11/2020  Tamesha Ellerbrock [facility] 2000-04-14 [id number]   Telephone outreach to patient to obtain mRS was successfully completed. MRS= 1   Vanice [facility] Care Management Assistant
